# Patient Record
Sex: Female | Born: 1955
Health system: Southern US, Community
[De-identification: ages and names within clinical notes are randomized; demographics above are authoritative.]

## PROBLEM LIST (undated history)

## (undated) DIAGNOSIS — E119 Type 2 diabetes mellitus without complications: Secondary | ICD-10-CM

## (undated) DIAGNOSIS — E785 Hyperlipidemia, unspecified: Secondary | ICD-10-CM

## (undated) DIAGNOSIS — I10 Essential (primary) hypertension: Secondary | ICD-10-CM

## (undated) DIAGNOSIS — E559 Vitamin D deficiency, unspecified: Secondary | ICD-10-CM

## (undated) DIAGNOSIS — G8190 Hemiplegia, unspecified affecting unspecified side: Secondary | ICD-10-CM

## (undated) DIAGNOSIS — F419 Anxiety disorder, unspecified: Secondary | ICD-10-CM

## (undated) DIAGNOSIS — I639 Cerebral infarction, unspecified: Secondary | ICD-10-CM

## (undated) DIAGNOSIS — M810 Age-related osteoporosis without current pathological fracture: Secondary | ICD-10-CM

## (undated) HISTORY — DX: Vitamin D deficiency, unspecified: E55.9

## (undated) HISTORY — DX: Hemiplegia, unspecified affecting unspecified side: G81.90

## (undated) HISTORY — DX: Type 2 diabetes mellitus without complications: E11.9

## (undated) HISTORY — DX: Age-related osteoporosis without current pathological fracture: M81.0

## (undated) HISTORY — DX: Cerebral infarction, unspecified: I63.9

## (undated) HISTORY — DX: Anxiety disorder, unspecified: F41.9

## (undated) HISTORY — PX: HERNIA REPAIR: SHX51

## (undated) HISTORY — DX: Essential (primary) hypertension: I10

## (undated) HISTORY — DX: Hyperlipidemia, unspecified: E78.5

## (undated) HISTORY — PX: TONSILLECTOMY: SUR1361

---

## 1999-03-27 ENCOUNTER — Ambulatory Visit (HOSPITAL_COMMUNITY): Admission: RE | Admit: 1999-03-27 | Discharge: 1999-03-27 | Payer: Self-pay | Admitting: Family Medicine

## 1999-03-27 ENCOUNTER — Encounter: Payer: Self-pay | Admitting: Family Medicine

## 2002-11-27 ENCOUNTER — Encounter (HOSPITAL_BASED_OUTPATIENT_CLINIC_OR_DEPARTMENT_OTHER): Payer: Self-pay | Admitting: General Surgery

## 2002-11-30 ENCOUNTER — Ambulatory Visit (HOSPITAL_COMMUNITY): Admission: RE | Admit: 2002-11-30 | Discharge: 2002-11-30 | Payer: Self-pay | Admitting: General Surgery

## 2004-12-03 ENCOUNTER — Encounter: Admission: RE | Admit: 2004-12-03 | Discharge: 2004-12-03 | Payer: Self-pay | Admitting: Family Medicine

## 2005-01-14 ENCOUNTER — Other Ambulatory Visit: Admission: RE | Admit: 2005-01-14 | Discharge: 2005-01-14 | Payer: Self-pay | Admitting: Family Medicine

## 2005-02-23 ENCOUNTER — Encounter: Admission: RE | Admit: 2005-02-23 | Discharge: 2005-05-24 | Payer: Self-pay | Admitting: Family Medicine

## 2014-04-05 ENCOUNTER — Telehealth: Payer: Self-pay | Admitting: Diagnostic Neuroimaging

## 2014-04-05 NOTE — Telephone Encounter (Signed)
Sarah from Dr. Michelle NasutiKnowlton's office calling to get diagnosis and procedure code for patient in order for [facility] to [facility], please return call and advise.

## 2014-04-05 NOTE — Telephone Encounter (Signed)
Called Sarah @ Dr. Michelle NasutiKnowlton's office. Advised we will not see the patient until 04/10/2014, may need to research on their end. Not sure how our office can be of assistance with the referral.  She agreed.

## 2014-04-10 ENCOUNTER — Telehealth: Payer: Self-pay | Admitting: *Deleted

## 2014-04-10 ENCOUNTER — Ambulatory Visit (INDEPENDENT_AMBULATORY_CARE_PROVIDER_SITE_OTHER): Payer: 59 | Admitting: Diagnostic Neuroimaging

## 2014-04-10 ENCOUNTER — Encounter: Payer: Self-pay | Admitting: Diagnostic Neuroimaging

## 2014-04-10 VITALS — BP 127/80 | HR 62 | Temp 98.2°F | Ht 66.0 in | Wt 308.0 lb

## 2014-04-10 DIAGNOSIS — I639 Cerebral infarction, unspecified: Secondary | ICD-10-CM

## 2014-04-10 NOTE — Patient Instructions (Signed)
Try physical therapy for right leg pain.  Take topiramate 50mg  at bedtime consistently for head prevention.  Try a fitness tracker (fitbit, misfit shine, moov now).

## 2014-04-10 NOTE — Progress Notes (Signed)
[facility]  PATIENT: Kelsey DrapeKathryn Mendez DOB: Aug 25, 1955  REFERRING CLINICIAN: Karilyn CotaGosrani HISTORY FROM: patient  REASON FOR VISIT: new consult    HISTORICAL  CHIEF COMPLAINT:  Chief Complaint  Patient presents with  . Cerebrovascular Accident  . Extremity Weakness  . Headache    HISTORY OF PRESENT ILLNESS:   58 year old female with hypertension, here for evaluation of post stroke memory loss and right leg pain.  01/16/2013 patient was living in [place], at work, had headache and took an aspirin. She is having difficulty fixing her computer. IT help came in was easily able to fix the computer. Patient drove home. Patient noticed her words were not coming out correctly. Next day patient had elevated blood pressure. The next day patient was having more slurred speech and went to the hospital. She had right arm and right leg weakness as well. Patient was diagnosed with a stroke, evaluated and then discharged with speech therapy, physical neck basal therapy.  [place] [facility] [place]. Patient still has ongoing cognitive problems, memory difficulty, right leg pain. Patient also having intermittent headaches in the evening. Sometimes she has photophobia and throbbing sensation. Patient was prescribed Topamax which has helped a little bit.  REVIEW OF SYSTEMS: Full 14 system review of systems performed and notable only for depression anxiety to much sleep disinterest in activities sleepiness restless legs headache numbness weakness feeling cold.  ALLERGIES: No Known Allergies  HOME MEDICATIONS: No outpatient prescriptions prior to visit.   No facility-administered medications prior to visit.   Prior to Admission medications   Medication Sig Start Date End Date Taking? Authorizing Provider  aspirin ([facility] ASPIRIN) 325 MG tablet Take 325 mg by mouth daily.   Yes Historical Provider, MD  atorvastatin (LIPITOR) 40 MG tablet Take 40 mg by mouth daily.    Yes Historical Provider, MD  metoprolol tartrate (LOPRESSOR) 25 MG tablet Take 25 mg by mouth 2 (two) times daily.   Yes Historical Provider, MD  topiramate (TOPAMAX) 50 MG tablet Take 50 mg by mouth at bedtime.   Yes Historical Provider, MD  escitalopram (LEXAPRO) 10 MG tablet Take 10 mg by mouth daily.    Historical Provider, MD    PAST MEDICAL HISTORY: Past Medical History  Diagnosis Date  . Stroke   . Hypertension     PAST SURGICAL HISTORY: Past Surgical History  Procedure Laterality Date  . Cesarean section    . Tonsillectomy    . Hernia repair      FAMILY HISTORY: Family History  Problem Relation Age of Onset  . Hypertension Mother   . Diabetes Mother   . Cancer Father   . Hypertension Father   . Glaucoma Father   . Congestive Heart Failure Sister   . Sarcoidosis Sister     SOCIAL HISTORY:  History   Social History  . Marital Status: Single    Spouse Name: N/A    Number of Children: 1  . Years of Education: BS   Occupational History  .  Other    disabled   Social History Main Topics  . Smoking status: Former Smoker -- 0.20 packs/day for 9 years    Types: Cigarettes  . Smokeless tobacco: Not on file  . Alcohol Use: No  . Drug Use: No  . Sexual Activity: Not on file   Other Topics Concern  . Not on file   Social History Narrative   Patient lives at home alone.   Caffeine Use: 2-3 cups  daily     PHYSICAL EXAM  Filed Vitals:   04/10/14 0837  BP: 127/80  Pulse: 62  Temp: 98.2 F (36.8 C)  TempSrc: Oral  Height: 5\' 6"  (1.676 m)  Weight: 308 lb (139.708 kg)    Body mass index is 49.74 kg/(m^2).   Visual Acuity Screening   Right eye Left eye Both eyes  Without correction:     With correction: 20/40 20/30     No flowsheet data found.  GENERAL EXAM: Patient is in no distress; well developed, nourished and groomed; neck is supple  CARDIOVASCULAR: Regular rate and rhythm, no murmurs, no carotid bruits  NEUROLOGIC: MENTAL STATUS:  awake, alert, oriented to person, place and time, recent and remote memory intact, normal attention and concentration, language fluent, comprehension intact, naming intact, fund of knowledge appropriate; SLIGHTLY SLOWED SPEECH PATTERN. CRANIAL NERVE: no papilledema on fundoscopic exam, pupils equal and reactive to light, visual fields full to confrontation, extraocular muscles intact, no nystagmus, facial sensation symmetric, DECR RIGHT NL FOLD AND RIGHT LOWER FACIAL STRENGTH; hearing intact, palate elevates symmetrically, uvula midline, shoulder shrug symmetric, tongue midline; MINIMAL DYSARTHRIA. MOTOR: normal bulk and tone, full strength in the LUE, LLE; RUE 4+. RLE 4 PROX, 5 DISTAL SENSORY: normal and symmetric to light touch, pinprick, temperature, vibration COORDINATION: finger-nose-finger, fine finger movements normal REFLEXES: deep tendon reflexes present and symmetric GAIT/STATION: narrow based gait; able to walk on toes, heels; romberg is negative    DIAGNOSTIC DATA (LABS, IMAGING, TESTING) - I reviewed patient records, labs, notes, testing and imaging myself where available.  No results found for: WBC, HGB, HCT, MCV, PLT No results found for: NA, K, CL, CO2, GLUCOSE, BUN, CREATININE, CALCIUM, PROT, ALBUMIN, AST, ALT, ALKPHOS, BILITOT, GFRNONAA, GFRAA No results found for: CHOL, HDL, LDLCALC, LDLDIRECT, TRIG, CHOLHDL No results found for: ZOXW9UHGBA1C No results found for: VITAMINB12 No results found for: TSH    ASSESSMENT AND PLAN  58 y.o. year old female here with history of stroke in 2014, likely left MCA distribution. Now with post stroke pain in right leg and mild cognitive difficulties. Also with migraine type headaches.  PLAN: - PT evaluation - request prior records - continue aspirin, statin, BP control for secondary stroke prevention - increase TPX to 50mg  BID  Orders Placed This Encounter  Procedures  . Ambulatory referral to Physical Therapy   Return in about 6  months (around 10/09/2014).    Suanne MarkerVIKRAM R. Lakeva Hollon, MD 04/10/2014, 9:45 AM Certified in Neurology, Neurophysiology and Neuroimaging  [facility] [address], [address] [place], [place] [address] [phone]

## 2014-04-10 NOTE — Telephone Encounter (Signed)
[facility]10[facility]14.

## 2014-05-07 ENCOUNTER — Telehealth: Payer: Self-pay | Admitting: *Deleted

## 2014-05-07 NOTE — Telephone Encounter (Signed)
Form, [employer] to [place] and Dr Marjory LiesPenumalli to be completed 05-07-14.

## 2014-05-08 DIAGNOSIS — Z0289 Encounter for other administrative examinations: Secondary | ICD-10-CM

## 2014-05-09 NOTE — Telephone Encounter (Signed)
Placed forms in Dr. Penumalli's in basket in office.  

## 2014-05-14 ENCOUNTER — Telehealth: Payer: Self-pay | Admitting: *Deleted

## 2014-05-14 NOTE — Telephone Encounter (Signed)
Form,[employer] received,completed by Dr Marjory LiesPenumalli and Tad Mooreasandra faxed 05/14/14.

## 2014-05-14 NOTE — Telephone Encounter (Signed)
Gave forms back to medical records. Lupita Leash(Donna)

## 2014-05-15 ENCOUNTER — Other Ambulatory Visit: Payer: Self-pay | Admitting: Diagnostic Neuroimaging

## 2014-05-15 ENCOUNTER — Telehealth: Payer: Self-pay

## 2014-05-15 ENCOUNTER — Ambulatory Visit: Payer: 59 | Attending: Diagnostic Neuroimaging

## 2014-05-15 DIAGNOSIS — I6980 Unspecified sequelae of other cerebrovascular disease: Secondary | ICD-10-CM | POA: Diagnosis not present

## 2014-05-15 DIAGNOSIS — R531 Weakness: Secondary | ICD-10-CM | POA: Diagnosis not present

## 2014-05-15 DIAGNOSIS — R269 Unspecified abnormalities of gait and mobility: Secondary | ICD-10-CM | POA: Insufficient documentation

## 2014-05-15 DIAGNOSIS — R279 Unspecified lack of coordination: Secondary | ICD-10-CM | POA: Insufficient documentation

## 2014-05-15 DIAGNOSIS — I639 Cerebral infarction, unspecified: Secondary | ICD-10-CM

## 2014-05-15 DIAGNOSIS — IMO0002 Reserved for concepts with insufficient information to code with codable children: Secondary | ICD-10-CM

## 2014-05-15 NOTE — Therapy (Signed)
[facility] [address] [place], [place], [address] Phone: [phone]   Fax:  [phone]  Physical Therapy Evaluation  Patient Details  Name: Kelsey Mendez MRN: [id number] Date of Birth: [birth year]  Encounter Date: 05/15/2014      PT End of Session - 05/15/14 1630    Visit Number 1   Number of Visits 17   Date for PT Re-Evaluation 07/14/14   Authorization Type [employer]   PT Start Time 1533   PT Stop Time 1614   PT Time Calculation (min) 41 min   Equipment Utilized During Treatment Gait belt   Activity Tolerance Patient tolerated treatment well   Behavior During Therapy [facility] for tasks assessed/performed      Past Medical History  Diagnosis Date  . Stroke   . Hypertension   . Osteoporosis     per patient    Past Surgical History  Procedure Laterality Date  . Cesarean section    . Tonsillectomy    . Hernia repair      There were no vitals taken for this visit.  Visit Diagnosis:  Abnormality of gait - Plan: PT plan of care cert/re-cert  Generalized weakness - Plan: PT plan of care cert/re-cert  Lack of coordination due to stroke - Plan: PT plan of care cert/re-cert      Subjective Assessment - 05/15/14 1539    Symptoms R-sided weakness, R LE numbness/tingling and pain, impaired balance during ambulation, falls during ambulation due to R toes "dragging on floor".    Patient Stated Goals Strengthen R side of body, loose weight to increase endurance and decrease risk of diabetes, walk without falling, to have better balance on steps/walking   Currently in Pain? Yes   Pain Score 5    Pain Location Leg   Pain Orientation Right   Pain Descriptors / Indicators Aching   Pain Type Chronic pain  occasional numbness   Pain Onset More than a month ago   Pain Frequency Constant   Aggravating Factors  sitting in a tight chair, walking, lying on R LE   Pain Relieving Factors rest          [facility] PT  Assessment - 05/15/14 0001    Precautions   Precautions Fall   Restrictions   Weight Bearing Restrictions No   Balance Screen   Has the patient fallen in the past 6 months Yes   How many times? 1-2   Has the patient had a decrease in activity level because of a fear of falling?  Yes   Is the patient reluctant to leave their home because of a fear of falling?  No   Home Environment   Living Enviornment Private residence   Living Arrangements Alone   Available Help at Discharge Family   Type of Home --  condo   Home Access Stairs to enter   Entrance Stairs-Number of Steps 1   Entrance Stairs-Rails None   Home Layout One level   Home Equipment None   Prior Function   Level of Independence Independent with basic ADLs;Independent with homemaking with ambulation;Independent with gait;Independent with transfers;Requires assistive device for independence   Cognition   Overall Cognitive Status Impaired/Different from baseline   Area of Impairment --  pt reported cognitive impairments and speech issues   Observation/Other Assessments   Observations Pt has difficulty with memory and word finding per pt report and was evident during eval. Pt reported she was unable to complete speech therapy due  to [place]   Sensation   Light Touch Impaired by gross assessment   Additional Comments Decreased R UE/LE light touch.   Coordination   Gross Motor Movements are Fluid and Coordinated Yes   Fine Motor Movements are Fluid and Coordinated No  decreased speed during alternating movements/finger to thumb   AROM   Overall AROM  Within functional limits for tasks performed   Strength   Overall Strength Deficits   Overall Strength Comments R LE grossly 3+/5 to 4/5, L UE grossly 4/5 to 4+/5.   Transfers   Transfers Sit to Stand;Stand to Sit   Sit to Stand 5: Supervision;With upper extremity assist;From chair/3-in-1   Stand to Sit 5: Supervision;With upper extremity assist;To  chair/3-in-1   Ambulation/Gait   Ambulation/Gait Yes   Ambulation/Gait Assistance 5: Supervision   Ambulation/Gait Assistance Details Pt ambulated over even terrain without LOB.   Ambulation Distance (Feet) 100 Feet   Assistive device None   Gait Pattern Step-through pattern;Decreased dorsiflexion - right;Trendelenburg  decreased trunk rotation   Gait velocity 2.429ft/sec.  no AD   Balance   Balance Assessed Yes   Static Standing Balance   Static Standing - Balance Support No upper extremity supported   Static Standing - Level of Assistance 4: Min assist;Other (comment)  min guard   Static Standing - Comment/# of Minutes Pt was able to perform feet apart/feet together with eyes open for 30 seconds without LOB, and feet together with eyes closed for 10 seconds with supervision due to increased postural sway. Pt unable to hold tandem stance without min A. Pt able to perform R single leg stance for 4 seconds before requiring min A to maintain balance.   Standardized Balance Assessment   Standardized Balance Assessment Timed Up and Go Test   Timed Up and Go Test   TUG Normal TUG   Normal TUG (seconds) 13.62  without AD                            PT Short Term Goals - 05/15/14 1634    PT SHORT TERM GOAL #1   Title Pt will be independent in HEP to improve functional mobility. Target date: 06/12/14.   Status New   PT SHORT TERM GOAL #2   Title Perform BERG and write STG and LTG. Target date: 06/12/14.   Status New   PT SHORT TERM GOAL #3   Title Pt will improve TUG time to < 13 seconds to decrease falls risk. Target date: 06/12/14.   Status New   PT SHORT TERM GOAL #4   Title Pt will report no falls in the last two weeks to improve safety. Target date: 06/12/14.   Status New   PT SHORT TERM GOAL #5   Title Pt will ambulate 300' over even/uneven terrain with supervision to improve functional mobility. Target date: 06/12/14.   Status New           PT Long Term  Goals - 05/15/14 1637    PT LONG TERM GOAL #1   Title Pt will verbalize agreement and understanding of CVA symptoms/signs to decrease risk for future CVA. Target date: 07/10/14.   Status New   PT LONG TERM GOAL #2   Title Have pt complete FOTO and write appropriate goal. Target date: 07/10/14.   Status New   PT LONG TERM GOAL #3   Title Pt will ambulate 600' over even/uneven terrain, independently,  to improve functional mobility. Target date: 07/10/14.   Status New   PT LONG TERM GOAL #4   Title Pt will verbalize plans to join fitness center after PT d/c to improve overall fitness. Target date: 07/10/14.   Status New               Plan - 05/15/14 1631    Clinical Impression Statement Pt is a pleasant 58 y/o female presenting to [facility]PP[facility] neuro s/p two CVAs in 12/2012. Pt reported her R-side is still weak from the stroke, she ambulates slowly due to impaired balance, R LE pain, and has fallen 1-2 times. Pt has history of plantar fasciitis on R LE.     Pt will benefit from skilled therapeutic intervention in order to improve on the following deficits Abnormal gait;Impaired flexibility;Decreased coordination;Decreased endurance;Impaired sensation;Obesity;Decreased knowledge of use of DME;Decreased balance;Decreased strength;Decreased mobility   Rehab Potential Good   PT Frequency 2x / week   PT Duration 8 weeks   PT Treatment/Interventions ADLs/Self Care Home Management;Gait training;Neuromuscular re-education;Stair training;Biofeedback;Functional mobility training;Patient/family education;Therapeutic activities;Cryotherapy;Electrical Stimulation;Therapeutic exercise;Manual techniques;Balance training;DME Instruction   PT Next Visit Plan Peform BERG, initiate balance/strength HEP, write FOTO goal and check for MD signature on PT cert   Consulted and Agree with Plan of Care Patient     Pt would benefit from speech therapy evaluation to address memory deficits and word finding  issues.    Problem List There are no active problems to display for this patient.   Kennley Schwandt L 05/15/2014, 4:45 PM  [facility] [address] [place], [place], [address] Phone: [phone]   Fax:  [phone]    Zerita BoersJennifer Ardit Danh, PT,DPT 05/15/2014 4:45 PM Phone: [phone] Fax: [phone]

## 2014-05-15 NOTE — Telephone Encounter (Signed)
Dr. Marjory LiesPenumalli~  I saw your patient, Kelsey Mendez, for a PT evaluation this afternoon. I feel that she would benefit from a speech therapy evaluation to address memory and word finding issues. If you agree with this, please send us a speech therapy referral order.   Thank you,  Zerita BoersJennifer Ameir Faria, PT, DPT

## 2014-05-20 ENCOUNTER — Telehealth: Payer: Self-pay | Admitting: *Deleted

## 2014-05-20 NOTE — Telephone Encounter (Signed)
Form,[employer] Parking Placard to Cassandra and Dr Marjory LiesPenumalli to complete 05-20-14.

## 2014-05-20 NOTE — Telephone Encounter (Signed)
Placed form in Dr. Richrd HumblesPenumalli's in basket in office.

## 2014-05-20 NOTE — Telephone Encounter (Signed)
Form,[employer] Parking Placard received,completed by Dr Marjory LiesPenumalli and Tad Mooreasandra at front desk for patient 05/20/14.

## 2014-05-28 ENCOUNTER — Ambulatory Visit: Payer: BLUE CROSS/BLUE SHIELD | Attending: Diagnostic Neuroimaging

## 2014-05-28 DIAGNOSIS — R269 Unspecified abnormalities of gait and mobility: Secondary | ICD-10-CM | POA: Insufficient documentation

## 2014-05-28 DIAGNOSIS — I6980 Unspecified sequelae of other cerebrovascular disease: Secondary | ICD-10-CM | POA: Insufficient documentation

## 2014-05-28 DIAGNOSIS — R279 Unspecified lack of coordination: Secondary | ICD-10-CM | POA: Insufficient documentation

## 2014-05-28 DIAGNOSIS — R531 Weakness: Secondary | ICD-10-CM | POA: Insufficient documentation

## 2014-05-30 ENCOUNTER — Ambulatory Visit: Payer: BLUE CROSS/BLUE SHIELD

## 2014-06-04 ENCOUNTER — Ambulatory Visit: Payer: BLUE CROSS/BLUE SHIELD

## 2014-06-04 ENCOUNTER — Ambulatory Visit: Payer: 59 | Admitting: Physical Therapy

## 2014-06-06 ENCOUNTER — Ambulatory Visit: Payer: BLUE CROSS/BLUE SHIELD

## 2014-06-07 ENCOUNTER — Ambulatory Visit: Payer: BLUE CROSS/BLUE SHIELD

## 2014-06-11 ENCOUNTER — Ambulatory Visit: Payer: BLUE CROSS/BLUE SHIELD

## 2014-06-13 ENCOUNTER — Ambulatory Visit: Payer: BLUE CROSS/BLUE SHIELD

## 2014-06-18 ENCOUNTER — Ambulatory Visit: Payer: BLUE CROSS/BLUE SHIELD

## 2014-06-18 ENCOUNTER — Telehealth: Payer: Self-pay

## 2014-06-18 DIAGNOSIS — R531 Weakness: Secondary | ICD-10-CM

## 2014-06-18 DIAGNOSIS — I6980 Unspecified sequelae of other cerebrovascular disease: Secondary | ICD-10-CM | POA: Diagnosis not present

## 2014-06-18 DIAGNOSIS — R269 Unspecified abnormalities of gait and mobility: Secondary | ICD-10-CM | POA: Diagnosis present

## 2014-06-18 DIAGNOSIS — IMO0002 Reserved for concepts with insufficient information to code with codable children: Secondary | ICD-10-CM

## 2014-06-18 DIAGNOSIS — R279 Unspecified lack of coordination: Secondary | ICD-10-CM | POA: Diagnosis not present

## 2014-06-18 NOTE — Telephone Encounter (Signed)
PT attempted to call pt in regarding no-show to 06/18/14 (1:15pm) PT appt. However, no one answered and PT was unable to leave message. If pt calls back, she needs to be informed that she needs to come to next PT appointment or she will be discharged, as pt has not been seen since PT eval on 05/15/14.

## 2014-06-18 NOTE — Therapy (Signed)
[facility] [address] [place], [place], [address] Phone: [phone]   Fax:  [phone]  Physical Therapy Treatment  Patient Details  Name: Kelsey Mendez MRN: [id number] Date of Birth: Jan 16, 1956 Referring Provider:  Doree Albee, MD  Encounter Date: 06/18/2014      PT End of Session - 06/18/14 1439    Visit Number 2   Number of Visits 17   Date for PT Re-Evaluation 07/14/14   Authorization Type UHC   PT Start Time 1331   PT Stop Time 1435   PT Time Calculation (min) 64 min   Equipment Utilized During Treatment Gait belt   Activity Tolerance Patient tolerated treatment well   Behavior During Therapy [facility] for tasks assessed/performed      Past Medical History  Diagnosis Date  . Stroke   . Hypertension   . Osteoporosis     per patient    Past Surgical History  Procedure Laterality Date  . Cesarean section    . Tonsillectomy    . Hernia repair      There were no vitals taken for this visit.  Visit Diagnosis:  Abnormality of gait  Generalized weakness  Lack of coordination due to stroke      Subjective Assessment - 06/18/14 1334    Symptoms Pt arrived 15 minutes late, she thought that her appt. was at 1330. Pt reported she has not been back to PT due to switching insurance and waiting on [employer] to send her new card. Pt denied falls or changes since last visit.   Patient Stated Goals Strengthen R side of body, loose weight to increase endurance and decrease risk of diabetes, walk without falling, to have better balance on steps/walking   Currently in Pain? Yes   Pain Score 5    Pain Location Leg   Pain Orientation Right   Pain Descriptors / Indicators Aching   Pain Type Chronic pain   Pain Onset More than a month ago   Pain Frequency Constant   Aggravating Factors  sitting in a tight chair, lying on R LE   Pain Relieving Factors rest   Multiple Pain Sites Yes   Pain Score 5   Pain Type  Acute pain   Pain Location Head  headache   Pain Orientation Mid   Pain Descriptors / Indicators Aching   Pain Frequency Several days a week     I treated this patient from 2:06-2:35 while Geoffry Paradise worked with another patient. Finished last few elements of [employer] and taught, performed and provided handout for HEP. See pt instructions for details. Delrae Sawyers, PT,DPT, NCS 06/18/2014 5:16 PM Phone: [phone] Fax: [phone]                   [facility] Adult PT Treatment/Exercise - 06/18/14 1342    Ambulation/Gait   Ambulation/Gait Yes   Ambulation/Gait Assistance 5: Supervision   Ambulation/Gait Assistance Details Pt ambulated over even/uneven terrain. VC's to improve stride length.   Ambulation Distance (Feet) 345 Feet   Assistive device None   Gait Pattern Step-through pattern;Decreased dorsiflexion - left;Trendelenburg  decreased trunk rotation   Standardized Balance Assessment   Standardized Balance Assessment Timed Up and Go Test;Berg Balance Test   Berg Balance Test   Sit to Stand Able to stand without using hands and stabilize independently   Standing Unsupported Able to stand safely 2 minutes   Sitting with Back Unsupported but Feet Supported on Floor or Stool  Able to sit safely and securely 2 minutes   Stand to Sit Controls descent by using hands   Transfers Able to transfer safely, definite need of hands   Standing Unsupported with Eyes Closed Able to stand 10 seconds with supervision   Standing Ubsupported with Feet Together Able to place feet together independently and stand 1 minute safely   From Standing, Reach Forward with Outstretched Arm Can reach forward >12 cm safely (5")   From Standing Position, Pick up Object from Floor Able to pick up shoe, needs supervision   From Standing Position, Turn to Look Behind Over each Shoulder Looks behind from both sides and weight shifts well   Turn 360 Degrees Able to turn 360 degrees  safely but slowly   Standing Unsupported, Alternately Place Feet on Step/Stool Able to stand independently and safely and complete 8 steps in 20 seconds   Standing Unsupported, One Foot in Front Able to place foot tandem independently and hold 30 seconds   Standing on One Leg Able to lift leg independently and hold equal to or more than 3 seconds   Total Score 47   Timed Up and Go Test   TUG Normal TUG   Normal TUG (seconds) 12.8  no AD                PT Education - 06/18/14 1438    Education provided Yes   Education Details HEP see pt instructions   Person(s) Educated Patient   Methods Explanation;Demonstration;Handout   Comprehension Verbalized understanding;Returned demonstration          PT Short Term Goals - 06/18/14 1707    PT SHORT TERM GOAL #1   Title Pt will be independent in HEP to improve functional mobility. Target date: 06/12/14.   Status On-going   PT SHORT TERM GOAL #2   Title Perform BERG and write STG and LTG. Target date: 06/12/14.   Status Achieved   PT SHORT TERM GOAL #3   Title Pt will improve TUG time to < 13 seconds to decrease falls risk. Target date: 06/12/14.   Status Achieved   PT SHORT TERM GOAL #4   Title Pt will report no falls in the last two weeks to improve safety. Target date: 06/12/14.   Status Achieved   PT SHORT TERM GOAL #5   Title Pt will ambulate 300' over even/uneven terrain with supervision to improve functional mobility. Target date: 06/12/14.   Status Achieved           PT Long Term Goals - 06/18/14 1708    PT LONG TERM GOAL #1   Title Pt will verbalize agreement and understanding of CVA symptoms/signs to decrease risk for future CVA. Target date: 07/10/14.   Status On-going   PT LONG TERM GOAL #2   Title Have pt complete FOTO and write appropriate goal. Target date: 07/10/14.   Status Achieved   PT LONG TERM GOAL #3   Title Pt will ambulate 600' over even/uneven terrain, independently, to improve functional mobility.  Target date: 07/10/14.   Status On-going   PT LONG TERM GOAL #4   Title Pt will verbalize plans to join fitness center after PT d/c to improve overall fitness. Target date: 07/10/14.   Status On-going   PT LONG TERM GOAL #5   Title Pt will improve BERG balance score to >/=51/56 to reduce falls risk. Target date: 07/10/14.   Status New   Additional Long Term Goals   Additional Long Term Goals  Yes   PT LONG TERM GOAL #6   Title Pt will improve SIS-mobility score by 10% to improve quality of life. Target date: 07/10/14.   Baseline 36.1%   Status New               Plan - 06/18/14 1705    Clinical Impression Statement Pt scored 47/56 on BERG balance scale, indicating pt is at a moderate falls risk. Pt demonstrated progress as she performed TUG in <13 seconds. Pt met STGs 3,4, and 5. Pt would continue to benefit from skilled PT to improve safety during functional mobility.    Pt will benefit from skilled therapeutic intervention in order to improve on the following deficits Abnormal gait;Impaired flexibility;Decreased coordination;Decreased endurance;Impaired sensation;Obesity;Decreased knowledge of use of DME;Decreased balance;Decreased strength;Decreased mobility   Rehab Potential Good   PT Frequency 2x / week   PT Duration 8 weeks   PT Treatment/Interventions ADLs/Self Care Home Management;Gait training;Neuromuscular re-education;Stair training;Biofeedback;Functional mobility training;Patient/family education;Therapeutic activities;Cryotherapy;Electrical Stimulation;Therapeutic exercise;Manual techniques;Balance training;DME Instruction   PT Next Visit Plan initiate strength HEP   Consulted and Agree with Plan of Care Patient        Problem List There are no active problems to display for this patient.   Gracyn Allor L 06/18/2014, 5:16 PM  [facility] [address] [place], [place], [address] Phone: [phone]    Fax:  [phone]    I saw this patient from 2:06-2:35. I finished the Berg and provided HEP. Delrae Sawyers, PT,DPT,NCS 06/18/2014 5:16 PM Phone: [phone] Fax: [phone]   I saw this pt from 1330-1406. I ambulated with the, assessed the TUG, and started the BERG. Geoffry Paradise, PT,DPT 06/18/2014 5:16 PM Phone: [phone] Fax: [phone]

## 2014-06-18 NOTE — Patient Instructions (Addendum)
Turning   Turn in a full circle 360 degrees toward the left, then 360 degrees toward the right. Perform in front of counter. Do 5 turns to each side daily.  Copyright  VHI. All rights reserved.    Braiding   Move to side: 1) cross right leg in front of left, 2) bring back leg out to side, then 3) cross right leg behind left, 4) bring left leg out to side. Continue sequence in same direction. Reverse sequence, moving in opposite direction.  Perform along countertop, hold on if needed. Do 3 laps to the left and 3 laps to the right daily. Copyright  VHI. All rights reserved.     Functional Quadriceps: Sit to Stand  Sit on edge of chair, bend knees so your feet are behind your knees on the floor. Lean forward and feel your weight shift to your toes. Stand upright, extending knees fully. Repeat 5 times per set. Do 2 sets per session. Do 1 sessions per day.  http://orth.exer.us/735   Copyright  VHI. All rights reserved.  Feet Together (Compliant Surface) Head Motion - Eyes Closed   Stand in a corner with a chair in front of you on compliant surface: two stacked pillows with feet together. Close eyes and move head slowly, horizontally 10x, then diagonally each direction 10x.   Copyright  VHI. All rights reserved.

## 2014-06-20 ENCOUNTER — Ambulatory Visit: Payer: BLUE CROSS/BLUE SHIELD

## 2014-06-20 DIAGNOSIS — R531 Weakness: Secondary | ICD-10-CM

## 2014-06-20 DIAGNOSIS — IMO0002 Reserved for concepts with insufficient information to code with codable children: Secondary | ICD-10-CM

## 2014-06-20 DIAGNOSIS — R269 Unspecified abnormalities of gait and mobility: Secondary | ICD-10-CM | POA: Diagnosis not present

## 2014-06-20 NOTE — Patient Instructions (Signed)
Hamstring Curl   Hold onto counter with 1-2 hands. Stand and bend left knee (heel towards buttocks). Repeat with right leg. Repeat _2 sets of 10__ reps.  Perform every other day.  Copyright  VHI. All rights reserved.  "I love a Parade" Lift   Using a chair if necessary (or hold onto counter). March right knee towards ceiling, lower, then march left knee towards ceiling then lower.  Repeat 2 sets of 10 times per leg.. Do once every other day.  http://gt2.exer.us/345   Copyright  VHI. All rights reserved.  Mini Squat: Double Leg   With feet shoulder width apart, reach forward for balance and do a mini squat. Keep knees in line with second toe. Knees do not go past toes. Repeat _10__ times per set. Rest _3__ seconds after set. Do __1_ sets per session per day.  http://plyo.exer.us/70   Copyright  VHI. All rights reserved.  EXTENSION: Standing (Active)   Stand, both feet flat. Draw right leg behind body as far as possible. Repeat with left leg. Complete _2__ sets of _10__ repetitions. Perform once every other day.  http://gtsc.exer.us/76   Copyright  VHI. All rights reserved.  ABDUCTION: Standing (Active)   Stand, feet flat, with kicking half step behind other leg. Lift right leg out to side. Repeat with left leg. Complete _2__ sets of _10__ repetitions. Perform once every other day.  http://gtsc.exer.us/110   Copyright  VHI. All rights reserved.

## 2014-06-20 NOTE — Therapy (Signed)
[facility] [address] [place], [place], [address] Phone: [phone]   Fax:  [phone]  Physical Therapy Treatment  Patient Details  Name: Kelsey DrapeKathryn Benzel MRN: [id number] Date of Birth: 01/09/1956 Referring Provider:  Wilson SingerGosrani, Nimish C, MD  Encounter Date: 06/20/2014      PT End of Session - 06/20/14 1527    Visit Number 3   Number of Visits 17   Date for PT Re-Evaluation 07/14/14   Authorization Type UHC   PT Start Time 1315   PT Stop Time 1400   PT Time Calculation (min) 45 min   Activity Tolerance Patient tolerated treatment well   Behavior During Therapy [facility] for tasks assessed/performed      Past Medical History  Diagnosis Date  . Stroke   . Hypertension   . Osteoporosis     per patient    Past Surgical History  Procedure Laterality Date  . Cesarean section    . Tonsillectomy    . Hernia repair      There were no vitals taken for this visit.  Visit Diagnosis:  Abnormality of gait  Generalized weakness  Lack of coordination due to stroke      Subjective Assessment - 06/20/14 1318    Symptoms Pt reported her legs are a little tired, as she [place] the mall today. Pt reported she had some difficulty with braiding balance activity. Pt denied falls or changes since last visit.   Patient Stated Goals Strengthen R side of body, loose weight to increase endurance and decrease risk of diabetes, walk without falling, to have better balance on steps/walking   Currently in Pain? No/denies      Therex: Standing in parallel bars with 1-2 UE support and supervision for safety, B LEs: -Hip marches, hip extension, hip abduction; 2x10. VC's and demonstration for technique and upright posture. -Hamstring curls 2x10. VC's for technique.  -Mini squats x10. VC's for technique (not to let toes go over toes). Pt required standing rest breaks after each set and two seated rest breaks due to fatigue and  B LE weakness.  Neuro Re-ed: -Braiding HEP, 4x7'. VC's and demonstration for technique.                     PT Education - 06/20/14 1322    Education provided Yes   Education Details Reviewed braiding HEP. Provided strengthening HEP.   Person(s) Educated Patient   Methods Explanation;Demonstration;Tactile cues;Verbal cues;Handout   Comprehension Verbalized understanding;Returned demonstration;Need further instruction          PT Short Term Goals - 06/18/14 1707    PT SHORT TERM GOAL #1   Title Pt will be independent in HEP to improve functional mobility. Target date: 06/12/14.   Status On-going   PT SHORT TERM GOAL #2   Title Perform BERG and write STG and LTG. Target date: 06/12/14.   Status Achieved   PT SHORT TERM GOAL #3   Title Pt will improve TUG time to < 13 seconds to decrease falls risk. Target date: 06/12/14.   Status Achieved   PT SHORT TERM GOAL #4   Title Pt will report no falls in the last two weeks to improve safety. Target date: 06/12/14.   Status Achieved   PT SHORT TERM GOAL #5   Title Pt will ambulate 300' over even/uneven terrain with supervision to improve functional mobility. Target date: 06/12/14.   Status Achieved  PT Long Term Goals - 06/20/14 1529    PT LONG TERM GOAL #1   Title Pt will verbalize agreement and understanding of CVA symptoms/signs to decrease risk for future CVA. Target date: 07/10/14.   Status On-going   PT LONG TERM GOAL #2   Title Have pt complete FOTO and write appropriate goal. Target date: 07/10/14.   Status Achieved   PT LONG TERM GOAL #3   Title Pt will ambulate 600' over even/uneven terrain, independently, to improve functional mobility. Target date: 07/10/14.   Status On-going   PT LONG TERM GOAL #4   Title Pt will verbalize plans to join fitness center after PT d/c to improve overall fitness. Target date: 07/10/14.   Status On-going   PT LONG TERM GOAL #5   Title Pt will improve BERG balance score  to >/=51/56 to reduce falls risk. Target date: 07/10/14.   Status On-going   PT LONG TERM GOAL #6   Title Pt will improve SIS-mobility score by 10% to improve quality of life. Target date: 07/10/14.   Baseline 36.1%   Status On-going               Plan - 06/20/14 1527    Clinical Impression Statement Pt demonstrated progress, as she required less cues and assist during braiding balance activity. Pt also tolerated standing strengthening HEP well. Pt continues to be limited by fatigue and B LE weakness, as she required seated and standing rest breaks during session. Pt would continue to benefit from skilled PT to improve safety during functional mobility.   Pt will benefit from skilled therapeutic intervention in order to improve on the following deficits Abnormal gait;Impaired flexibility;Decreased coordination;Decreased endurance;Impaired sensation;Obesity;Decreased knowledge of use of DME;Decreased balance;Decreased strength;Decreased mobility   Rehab Potential Good   PT Frequency 2x / week   PT Duration 8 weeks   PT Treatment/Interventions ADLs/Self Care Home Management;Gait training;Neuromuscular re-education;Stair training;Biofeedback;Functional mobility training;Patient/family education;Therapeutic activities;Cryotherapy;Electrical Stimulation;Therapeutic exercise;Manual techniques;Balance training;DME Instruction   PT Next Visit Plan Progress dynamic gait and balance training.   PT Home Exercise Plan Balance and strengthening HEP.   Consulted and Agree with Plan of Care Patient        Problem List There are no active problems to display for this patient.   Miller,Jennifer L 06/20/2014, 3:31 PM  [facility] [address] [place], [place], [address] Phone: [phone]   Fax:  [phone]    Zerita Boers, PT,DPT 06/20/2014 3:31 PM Phone: [phone] Fax: [phone]

## 2014-06-25 ENCOUNTER — Ambulatory Visit: Payer: BLUE CROSS/BLUE SHIELD | Attending: Diagnostic Neuroimaging

## 2014-06-25 DIAGNOSIS — I6980 Unspecified sequelae of other cerebrovascular disease: Secondary | ICD-10-CM | POA: Diagnosis not present

## 2014-06-25 DIAGNOSIS — R269 Unspecified abnormalities of gait and mobility: Secondary | ICD-10-CM | POA: Insufficient documentation

## 2014-06-25 DIAGNOSIS — R279 Unspecified lack of coordination: Secondary | ICD-10-CM | POA: Insufficient documentation

## 2014-06-25 DIAGNOSIS — R531 Weakness: Secondary | ICD-10-CM

## 2014-06-25 DIAGNOSIS — IMO0002 Reserved for concepts with insufficient information to code with codable children: Secondary | ICD-10-CM

## 2014-06-25 NOTE — Therapy (Signed)
[facility] [address] [place], [place], [address] Phone: [phone]   Fax:  [phone]  Physical Therapy Treatment  Patient Details  Name: Kelsey Mendez MRN: [id number] Date of Birth: 1955-10-16 Referring Provider:  Wilson SingerGosrani, Nimish C, MD  Encounter Date: 06/25/2014      PT End of Session - 06/25/14 1625    Visit Number 4   Number of Visits 17   Date for PT Re-Evaluation 07/14/14   Authorization Type UHC   PT Start Time 1533   PT Stop Time 1613   PT Time Calculation (min) 40 min   Equipment Utilized During Treatment Gait belt   Activity Tolerance Patient tolerated treatment well   Behavior During Therapy [facility] for tasks assessed/performed      Past Medical History  Diagnosis Date  . Stroke   . Hypertension   . Osteoporosis     per patient    Past Surgical History  Procedure Laterality Date  . Cesarean section    . Tonsillectomy    . Hernia repair      There were no vitals taken for this visit.  Visit Diagnosis:  Abnormality of gait  Generalized weakness  Lack of coordination due to stroke      Subjective Assessment - 06/25/14 1535    Symptoms Pt denied falls or changes since last visit.    Patient Stated Goals Strengthen R side of body, loose weight to increase endurance and decrease risk of diabetes, walk without falling, to have better balance on steps/walking   Currently in Pain? No/denies                    [facility] Adult PT Treatment/Exercise - 06/25/14 1555    Ambulation/Gait   Ambulation/Gait Yes   Ambulation/Gait Assistance 5: Supervision   Ambulation/Gait Assistance Details Pt ambulated over compliant and non-compliant surfaces, with and without head turns. Pt ambulated forwards, backwards, lateral, and hip marches over compliant surfaces (4x7'/activity). VC's to improve stride length, heel strike, and to improve eccentric control during hip marches. Cues to activate TrA  during hip marches to improve technique and balance.   Ambulation Distance (Feet) --  400, 75',16x7'   Assistive device None   Gait Pattern Step-through pattern;Decreased dorsiflexion - left;Trendelenburg   Stairs Yes   Stairs Assistance 5: Supervision   Stairs Assistance Details (indicate cue type and reason) VC's to improve anterior weight shifting   Stair Management Technique One rail Right;Alternating pattern   Number of Stairs 4   Height of Stairs 6   Ramp 5: Supervision   Ramp Details (indicate cue type and reason) x3. Pt ascended/descended ramp with supervision to ensure safety. VC's to improve weight shifting.                PT Education - 06/25/14 1622    Education provided Yes   Education Details CVA education handout. PT also educated pt on accessing [facility] "my chart" in order to review appointment/HEP information, as pt requested HEP via email but we are not able to email HEP due to HIPAA concerns. PT also educated pt on continuing to improve endurance and strength by increasing ambulation time, with a goal of 30 minutes per day.   Person(s) Educated Patient   Methods Explanation;Handout   Comprehension Verbalized understanding          PT Short Term Goals - 06/18/14 1707    PT SHORT TERM GOAL #1   Title Pt will be  independent in HEP to improve functional mobility. Target date: 06/12/14.   Status On-going   PT SHORT TERM GOAL #2   Title Perform BERG and write STG and LTG. Target date: 06/12/14.   Status Achieved   PT SHORT TERM GOAL #3   Title Pt will improve TUG time to < 13 seconds to decrease falls risk. Target date: 06/12/14.   Status Achieved   PT SHORT TERM GOAL #4   Title Pt will report no falls in the last two weeks to improve safety. Target date: 06/12/14.   Status Achieved   PT SHORT TERM GOAL #5   Title Pt will ambulate 300' over even/uneven terrain with supervision to improve functional mobility. Target date: 06/12/14.   Status Achieved            PT Long Term Goals - 06/20/14 1529    PT LONG TERM GOAL #1   Title Pt will verbalize agreement and understanding of CVA symptoms/signs to decrease risk for future CVA. Target date: 07/10/14.   Status On-going   PT LONG TERM GOAL #2   Title Have pt complete FOTO and write appropriate goal. Target date: 07/10/14.   Status Achieved   PT LONG TERM GOAL #3   Title Pt will ambulate 600' over even/uneven terrain, independently, to improve functional mobility. Target date: 07/10/14.   Status On-going   PT LONG TERM GOAL #4   Title Pt will verbalize plans to join fitness center after PT d/c to improve overall fitness. Target date: 07/10/14.   Status On-going   PT LONG TERM GOAL #5   Title Pt will improve BERG balance score to >/=51/56 to reduce falls risk. Target date: 07/10/14.   Status On-going   PT LONG TERM GOAL #6   Title Pt will improve SIS-mobility score by 10% to improve quality of life. Target date: 07/10/14.   Baseline 36.1%   Status On-going               Plan - 06/25/14 1626    Clinical Impression Statement Pt demonstrated progress, as she was able to ambulate over compliant surfaces without LOB. However, pt continues to require cues to improve stride length and heel strike due to decreased endurance and weakness during ambulation. Pt would continue to benefit from skilled PT to improve safety during functional mobility.   Pt will benefit from skilled therapeutic intervention in order to improve on the following deficits Abnormal gait;Impaired flexibility;Decreased coordination;Decreased endurance;Impaired sensation;Obesity;Decreased knowledge of use of DME;Decreased balance;Decreased strength;Decreased mobility   Rehab Potential Good   PT Frequency 2x / week   PT Duration 8 weeks   PT Treatment/Interventions ADLs/Self Care Home Management;Gait training;Neuromuscular re-education;Stair training;Biofeedback;Functional mobility training;Patient/family  education;Therapeutic activities;Cryotherapy;Electrical Stimulation;Therapeutic exercise;Manual techniques;Balance training;DME Instruction   PT Next Visit Plan Continue to progress dynamic gait/balance training. Trial cones taps on compliant/non-compliant surfaces to improve weight shifting and step-ups.   PT Home Exercise Plan Balance and strengthening HEP.   Consulted and Agree with Plan of Care Patient        Problem List There are no active problems to display for this patient.   Maeson Purohit L 06/25/2014, 4:29 PM  [facility] [address] [place], [place], [address] Phone: [phone]   Fax:  [phone]     Zerita Boers, PT,DPT 06/25/2014 4:29 PM Phone: [phone] Fax: [phone]

## 2014-06-25 NOTE — Patient Instructions (Addendum)
RE: MyChart  Dear Ms. Crouse  We are excited to introduce MyChart, a new best-in-class service that provides you online access to important information in your electronic medical record. We want to make it easier for you to view your health information - all in one secure location - when and where you need it. We expect MyChart will enhance the quality of care and service we provide. Use the activation code below to enroll in MyChart online at [email or web address]  When you register for MyChart, you can:  Marland Kitchen View your test results. . Communicate securely with your physician's office.  . View your medical history, allergies, medications, and immunizations. . Conveniently print information such as your medication lists.  If you are age 74 or older and want a member of your family to have access to your record, you must provide written consent by completing a proxy form available at our facility. Please speak to our clinical staff about guidelines regarding accounts for patients younger than age 62.  As you activate your MyChart account and need any technical assistance, please call the MyChart technical support line at [phone]) or email your question to [email or web address]. If you email your question(s), please include your name, a return phone number and the best time to reach you.  Thank you for using MyChart as your new health and wellness resource!  MyChart Activation Code:  [email or web address] Expires: 08/19/2014  1:58 PM     Ischemic Stroke A stroke (cerebrovascular accident) is the sudden death of brain tissue. It is a medical emergency. A stroke can cause permanent loss of brain function. This can cause problems with different parts of your body. A transient ischemic attack (TIA) is different because it does not cause permanent damage. A TIA is a short-lived problem of poor blood flow affecting a part of the brain. A TIA is also a serious problem because  having a TIA greatly increases the chances of having a stroke. When symptoms first develop, you cannot know if the problem might be a stroke or a TIA. CAUSES  A stroke is caused by a decrease of oxygen supply to an area of your brain. It is usually the result of a small blood clot or collection of cholesterol or fat (plaque) that blocks blood flow in the brain. A stroke can also be caused by blocked or damaged carotid arteries.  RISK FACTORS  High blood pressure (hypertension).  High cholesterol.  Diabetes mellitus.  Heart disease.  The buildup of plaque in the blood vessels (peripheral artery disease or atherosclerosis).  The buildup of plaque in the blood vessels providing blood and oxygen to the brain (carotid artery stenosis).  An abnormal heart rhythm (atrial fibrillation).  Obesity.  Smoking.  Taking oral contraceptives (especially in combination with smoking).  Physical inactivity.  A diet high in fats, salt (sodium), and calories.  Alcohol use.  Use of illegal drugs (especially cocaine and methamphetamine).  Being African American.  Being over the age of 55.  Family history of stroke.  Previous history of blood clots, stroke, TIA, or heart attack.  Sickle cell disease. SYMPTOMS  These symptoms usually develop suddenly, or may be newly present upon awakening from sleep:  Sudden weakness or numbness of the face, arm, or leg, especially on one side of the body.  Sudden trouble walking or difficulty moving arms or legs.  Sudden confusion.  Sudden personality changes.  Trouble speaking (aphasia) or understanding.  Difficulty swallowing.  Sudden trouble seeing in one or  both eyes.  Double vision.  Dizziness.  Loss of balance or coordination.  Sudden severe headache with no known cause.  Trouble reading or writing. DIAGNOSIS  Your health care provider can often determine the presence or absence of a stroke based on your symptoms, history, and  physical exam. Computed tomography (CT) of the brain is usually performed to confirm the stroke, determine causes, and determine stroke severity. Other tests may be done to find the cause of the stroke. These tests may include:  Electrocardiography.  Continuous heart monitoring.  Echocardiography.  Carotid ultrasonography.  Magnetic resonance imaging (MRI).  A scan of the brain circulation.  Blood tests. PREVENTION  The risk of a stroke can be decreased by appropriately treating high blood pressure, high cholesterol, diabetes, heart disease, and obesity and by quitting smoking, limiting alcohol, and staying physically active. TREATMENT  Time is of the essence. It is important to seek treatment at the first sign of these symptoms because you may receive a medicine to dissolve the clot (thrombolytic) that cannot be given if too much time has passed since your symptoms began. Even if you do not know when your symptoms began, get treatment as soon as possible as there are other treatment options available including oxygen, intravenous (IV) fluids, and medicines to thin the blood (anticoagulants). Treatment of stroke depends on the duration, severity, and cause of your symptoms. Medicines and dietary changes may be used to address diabetes, high blood pressure, and other risk factors. Physical, speech, and occupational therapists will assess you and work with you to improve any functions impaired by the stroke. Measures will be taken to prevent short-term and long-term complications, including infection from breathing foreign material into the lungs (aspiration pneumonia), blood clots in the legs, bedsores, and falls. Rarely, surgery may be needed to remove large blood clots or to open up blocked arteries. HOME CARE INSTRUCTIONS   Take medicines only as directed by your health care provider. Follow the directions carefully. Medicines may be used to control risk factors for a stroke. Be sure you  understand all your medicine instructions.  You may be told to take a medicine to thin the blood, such as aspirin or the anticoagulant warfarin. Warfarin needs to be taken exactly as instructed.  Too much and too little warfarin are both dangerous. Too much warfarin increases the risk of bleeding. Too little warfarin continues to allow the risk for blood clots. While taking warfarin, you will need to have regular blood tests to measure your blood clotting time. These blood tests usually include both the PT and INR tests. The PT and INR results allow your health care provider to adjust your dose of warfarin. The dose can change for many reasons. It is critically important that you take warfarin exactly as prescribed, and that you have your PT and INR levels drawn exactly as directed.  Many foods, especially foods high in vitamin K, can interfere with warfarin and affect the PT and INR results. Foods high in vitamin K include spinach, kale, broccoli, cabbage, collard and turnip greens, brussels sprouts, peas, cauliflower, seaweed, and parsley, as well as beef and pork liver, green tea, and soybean oil. You should eat a consistent amount of foods high in vitamin K. Avoid major changes in your diet, or notify your health care provider before changing your diet. Arrange a visit with a dietitian to answer your questions.  Many medicines can interfere with warfarin and affect the PT and INR results. You must tell your  health care provider about any and all medicines you take. This includes all vitamins and supplements. Be especially cautious with aspirin and anti-inflammatory medicines. Do not take or discontinue any prescribed or over-the-counter medicine except on the advice of your health care provider or pharmacist.  Warfarin can have side effects, such as excessive bruising or bleeding. You will need to hold pressure over cuts for longer than usual. Your health care provider or pharmacist will discuss other  potential side effects.  Avoid sports or activities that may cause injury or bleeding.  Be mindful when shaving, flossing your teeth, or handling sharp objects.  Alcohol can change the body's ability to handle warfarin. It is best to avoid alcoholic drinks or consume only very small amounts while taking warfarin. Notify your health care provider if you change your alcohol intake.  Notify your dentist or other health care providers before procedures.  If swallow studies have determined that your swallowing reflex is present, you should eat healthy foods. Including 5 or more servings of fruits and vegetables a day may reduce the risk of stroke. Foods may need to be a certain consistency (soft or pureed), or small bites may need to be taken in order to avoid aspirating or choking. Certain dietary changes may be advised to address high blood pressure, high cholesterol, diabetes, or obesity.  Food choices that are low in sodium, saturated fat, trans fat, and cholesterol are recommended to manage high blood pressure.  Food choies that are high in fiber, and low in saturated fat, trans fat, and cholesterol may control cholesterol levels.  Controlling carbohydrates and sugar intake is recommended to manage diabetes.  Reducing calorie intake and making food choices that are low in sodium, saturated fat, trans fat, and cholesterol are recommended to manage obesity.  Maintain a healthy weight.  Stay physically active. It is recommended that you get at least 30 minutes of activity on all or most days.  Do not use any tobacco products including cigarettes, chewing tobacco, or electronic cigarettes.  Limit alcohol use even if you are not taking warfarin. Moderate alcohol use is considered to be:  No more than 2 drinks each day for men.  No more than 1 drink each day for nonpregnant women.  Home safety. A safe home environment is important to reduce the risk of falls. Your health care provider may  arrange for specialists to evaluate your home. Having grab bars in the bedroom and bathroom is often important. Your health care provider may arrange for equipment to be used at home, such as raised toilets and a seat for the shower.  Physical, occupational, and speech therapy. Ongoing therapy may be needed to maximize your recovery after a stroke. If you have been advised to use a walker or a cane, use it at all times. Be sure to keep your therapy appointments.  Follow all instructions for follow-up with your health care provider. This is very important. This includes any referrals, physical therapy, rehabilitation, and lab tests. Proper follow-up can prevent another stroke from occurring. SEEK MEDICAL CARE IF:  You have personality changes.  You have difficulty swallowing.  You are seeing double.  You have dizziness.  You have a fever.  You have skin breakdown. SEEK IMMEDIATE MEDICAL CARE IF:  Any of these symptoms may represent a serious problem that is an emergency. Do not wait to see if the symptoms will go away. Get medical help right away. Call your local emergency services (911 in [place] Do not  drive yourself to the hospital.  You have sudden weakness or numbness of the face, arm, or leg, especially on one side of the body.  You have sudden trouble walking or difficulty moving arms or legs.  You have sudden confusion.  You have trouble speaking (aphasia) or understanding.  You have sudden trouble seeing in one or both eyes.  You have a loss of balance or coordination.  You have a sudden, severe headache with no known cause.  You have new chest pain or an irregular heartbeat.  You have a partial or total loss of consciousness. Document Released: 05/10/2005 Document Revised: 09/24/2013 Document Reviewed: 12/19/2011 [facility] Patient Information 2015 [place], [place]. This information is not intended to replace advice given to you by your health care provider. Make sure you  discuss any questions you have with your health care provider.

## 2014-06-28 ENCOUNTER — Ambulatory Visit: Payer: BLUE CROSS/BLUE SHIELD

## 2014-06-28 ENCOUNTER — Encounter: Payer: Self-pay | Admitting: Physical Therapy

## 2014-06-28 ENCOUNTER — Ambulatory Visit: Payer: BLUE CROSS/BLUE SHIELD | Admitting: Physical Therapy

## 2014-06-28 DIAGNOSIS — R269 Unspecified abnormalities of gait and mobility: Secondary | ICD-10-CM | POA: Diagnosis not present

## 2014-06-28 DIAGNOSIS — R41841 Cognitive communication deficit: Secondary | ICD-10-CM

## 2014-06-28 DIAGNOSIS — IMO0002 Reserved for concepts with insufficient information to code with codable children: Secondary | ICD-10-CM

## 2014-06-28 DIAGNOSIS — R531 Weakness: Secondary | ICD-10-CM

## 2014-06-28 NOTE — Therapy (Signed)
[facility] [address] [place], [place], [address] Phone: [phone]   Fax:  [phone]  Speech Language Pathology Evaluation  Patient Details  Name: Kelsey Mendez MRN: [id number] Date of Birth: Oct 13, 1955 Referring Provider:  Wilson Singer, MD  Encounter Date: 06/28/2014      End of Session - 06/28/14 1704    Visit Number 1   Number of Visits 16   Date for SLP Re-Evaluation 08/27/14   SLP Start Time 1450   SLP Stop Time  1530   SLP Time Calculation (min) 40 min   Activity Tolerance Patient tolerated treatment well      Past Medical History  Diagnosis Date  . Stroke   . Hypertension   . Osteoporosis     per patient    Past Surgical History  Procedure Laterality Date  . Cesarean section    . Tonsillectomy    . Hernia repair      There were no vitals taken for this visit.  Visit Diagnosis: Cognitive communication deficit - Plan: SLP plan of care cert/re-cert        SLP Evaluation [facility] - 06/28/14 1451    SLP Visit Information   SLP Received On 06/28/14   Onset Date 01-16-13   Medical Diagnosis CVA   Subjective   Subjective "I  have a lot of cognitive problems. They gave me a lot of tests in [place]."Pt drove here today.   Pain Assessment   Currently in Pain? No/denies   General Information   HPI Pt with CVA in August 2014. Disability March 2015, moved to [facility] July 2015.    Prior Functional Status   Cognitive/Linguistic Baseline Baseline deficits   Education 4 year degree   Vocation On disability   Cognition   Overall Cognitive Status Impaired/Different from baseline   Area of Impairment Memory;Attention  Pt relates decr'd divided attention   Attention Comments reports "I have to do one thing at a time now"   Attention Focused;Sustained;Selective   Focused Attention Appears intact   Sustained Attention Appears intact   Selective Attention Appears intact   Memory Impaired   Memory Impairment Retrieval deficit;Decreased short term memory   Verbal Expression   Overall Verbal Expression Appears within functional limits for tasks assessed   Standardized Assessments   Standardized Assessments  Montreal Cognitive Assessment (MOCA)   Montreal Cognitive Assessment (MOCA)  22/30               SLP Short Term Goals - 06/28/14 1707    SLP SHORT TERM GOAL #1   Title pt will demo compensations for memory in complex linguistic tasks with rare min cues   Time 4   Period Weeks   Status New   SLP SHORT TERM GOAL #2   Title pt will demo compensations for attention in complex linguistic tasks with rare min cues   Time 4   Period Weeks   Status New   SLP SHORT TERM GOAL #3   Title pt will achieve 85% success in tasks of simple linguistic executive function   Time 4   Period Weeks   Status New          SLP Long Term Goals - 06/28/14 1712    SLP LONG TERM GOAL #1   Title demo memory compensations in complex linguistic tasks   Time 8   Period Weeks   Status New   SLP LONG TERM GOAL #2   Title demo compensations for  attention in complex linguistic tasks   Time 8   Period Weeks   Status New   SLP LONG TERM GOAL #3   Title demo appropriate skills in executive function for 95% success in linguistic tasks   Time 8   Period Weeks   Status New          Plan - 06/28/14 1704    Clinical Impression Statement Pt presents with mild-mod cognitive deficit hindering vocational opportunities due to CVA in August 2014. Pt will be seen for 4 weeks and if notable progress made will cont for another 4 weeks. Pt used compensations for memory today during eval.   Speech Therapy Frequency 2x / week   Duration --  8 weeks, d/c after 4 weeks possible   Treatment/Interventions Environmental controls;Internal/external aids;Compensatory strategies;SLP instruction and feedback;Patient/family education;Functional tasks;Cueing hierarchy   Potential to Achieve Goals Fair    Potential Considerations Severity of impairments        Problem List There are no active problems to display for this patient.   [facility] The University [facility] PennsylvaniaCHINKE,Kinesha Auten, SLP 06/28/2014, 5:16 PM  [facility] [address] [place], [place], [address] Phone: [phone]   Fax:  [phone]

## 2014-06-28 NOTE — Therapy (Signed)
[facility] [address] [place], [place], [address] Phone: [phone]   Fax:  [phone]  Physical Therapy Treatment  Patient Details  Name: Kelsey Mendez MRN: [id number] Date of Birth: 07-18-1955 Referring Provider:  Wilson Singer, MD  Encounter Date: 06/28/2014      PT End of Session - 06/28/14 1406    Visit Number 5   Number of Visits 17   Date for PT Re-Evaluation 07/14/14   Authorization Type [employer]   PT Start Time 1401   PT Stop Time 1441   PT Time Calculation (min) 40 min   Equipment Utilized During Treatment Gait belt   Activity Tolerance Patient tolerated treatment well   Behavior During Therapy [facility] for tasks assessed/performed      Past Medical History  Diagnosis Date  . Stroke   . Hypertension   . Osteoporosis     per patient    Past Surgical History  Procedure Laterality Date  . Cesarean section    . Tonsillectomy    . Hernia repair      There were no vitals taken for this visit.  Visit Diagnosis:  Abnormality of gait  Generalized weakness  Lack of coordination due to stroke      Subjective Assessment - 06/28/14 1405    Symptoms No new complaints. No falls or pain to report.   Currently in Pain? No/denies   Pain Score 0-No pain     Treatment: Neuro Re-ed - on red/blue mats at counter: high knee marches, toe walk, heel walk and tandem walk, all forward/backward, x 3 laps each/each way with min guard assist to min assist for balance.  - blue mat with tall cones: alternating forward toe tap, cross toe taps, forward double toe taps, cross double toe taps, flip over/up and cross-forward toe taps, x 10 each bil legs with min assist for balance.        PT Short Term Goals - 06/18/14 1707    PT SHORT TERM GOAL #1   Title Pt will be independent in HEP to improve functional mobility. Target date: 06/12/14.   Status On-going   PT SHORT TERM GOAL #2   Title Perform BERG and  write STG and LTG. Target date: 06/12/14.   Status Achieved   PT SHORT TERM GOAL #3   Title Pt will improve TUG time to < 13 seconds to decrease falls risk. Target date: 06/12/14.   Status Achieved   PT SHORT TERM GOAL #4   Title Pt will report no falls in the last two weeks to improve safety. Target date: 06/12/14.   Status Achieved   PT SHORT TERM GOAL #5   Title Pt will ambulate 300' over even/uneven terrain with supervision to improve functional mobility. Target date: 06/12/14.   Status Achieved           PT Long Term Goals - 06/20/14 1529    PT LONG TERM GOAL #1   Title Pt will verbalize agreement and understanding of CVA symptoms/signs to decrease risk for future CVA. Target date: 07/10/14.   Status On-going   PT LONG TERM GOAL #2   Title Have pt complete FOTO and write appropriate goal. Target date: 07/10/14.   Status Achieved   PT LONG TERM GOAL #3   Title Pt will ambulate 600' over even/uneven terrain, independently, to improve functional mobility. Target date: 07/10/14.   Status On-going   PT LONG TERM GOAL #4   Title Pt will verbalize plans  to join fitness center after PT d/c to improve overall fitness. Target date: 07/10/14.   Status On-going   PT LONG TERM GOAL #5   Title Pt will improve BERG balance score to >/=51/56 to reduce falls risk. Target date: 07/10/14.   Status On-going   PT LONG TERM GOAL #6   Title Pt will improve SIS-mobility score by 10% to improve quality of life. Target date: 07/10/14.   Baseline 36.1%   Status On-going           Plan - 06/28/14 1406    Clinical Impression Statement Pt making steady progress toward goals. Does fatigue quickly with higher level balance activities, however recovers quickly as well.   Pt will benefit from skilled therapeutic intervention in order to improve on the following deficits Abnormal gait;Impaired flexibility;Decreased coordination;Decreased endurance;Impaired sensation;Obesity;Decreased knowledge of use of  DME;Decreased balance;Decreased strength;Decreased mobility   Rehab Potential Good   PT Frequency 2x / week   PT Duration 8 weeks   PT Treatment/Interventions ADLs/Self Care Home Management;Gait training;Neuromuscular re-education;Stair training;Biofeedback;Functional mobility training;Patient/family education;Therapeutic activities;Cryotherapy;Electrical Stimulation;Therapeutic exercise;Manual techniques;Balance training;DME Instruction   PT Next Visit Plan Continue to progress dynamic gait/balance training.    PT Home Exercise Plan Balance and strengthening HEP.   Consulted and Agree with Plan of Care Patient        Problem List There are no active problems to display for this patient.   Sallyanne KusterBury, Kelsey Mendez 06/28/2014, 6:02 PM  Sallyanne KusterKathy Daquawn Mendez, PTA, [facility] [address], [address] [place], [place] [address] [phone] 06/28/2014, 6:02 PM

## 2014-07-02 ENCOUNTER — Ambulatory Visit: Payer: BLUE CROSS/BLUE SHIELD | Admitting: Speech Pathology

## 2014-07-02 ENCOUNTER — Ambulatory Visit: Payer: BLUE CROSS/BLUE SHIELD

## 2014-07-02 DIAGNOSIS — R269 Unspecified abnormalities of gait and mobility: Secondary | ICD-10-CM

## 2014-07-02 DIAGNOSIS — R531 Weakness: Secondary | ICD-10-CM

## 2014-07-02 DIAGNOSIS — R41841 Cognitive communication deficit: Secondary | ICD-10-CM

## 2014-07-02 DIAGNOSIS — IMO0002 Reserved for concepts with insufficient information to code with codable children: Secondary | ICD-10-CM

## 2014-07-02 NOTE — Therapy (Signed)
[facility] [address] [place], [place], [address] Phone: [phone]   Fax:  [phone]  Physical Therapy Treatment  Patient Details  Name: Kelsey DrapeKathryn Mendez MRN: [id number] Date of Birth: Jan 23, 1956 Referring Provider:  Wilson SingerGosrani, Nimish C, MD  Encounter Date: 07/02/2014      PT End of Session - 07/02/14 1632    Visit Number 6   Number of Visits 17   Date for PT Re-Evaluation 07/14/14   Authorization Type UHC   PT Start Time 1411   PT Stop Time 1444   PT Time Calculation (min) 33 min   Equipment Utilized During Treatment Gait belt   Activity Tolerance Patient tolerated treatment well   Behavior During Therapy [facility] for tasks assessed/performed      Past Medical History  Diagnosis Date  . Stroke   . Hypertension   . Osteoporosis     per patient    Past Surgical History  Procedure Laterality Date  . Cesarean section    . Tonsillectomy    . Hernia repair      There were no vitals taken for this visit.  Visit Diagnosis:  Abnormality of gait  Generalized weakness  Lack of coordination due to stroke      Subjective Assessment - 07/02/14 1412    Symptoms Pt arrived 10 minutes late. Pt denied falls or changes since last visit.    Patient Stated Goals Strengthen R side of body, loose weight to increase endurance and decrease risk of diabetes, walk without falling, to have better balance on steps/walking   Currently in Pain? Yes   Pain Score 3    Pain Location Leg   Pain Orientation Right   Pain Descriptors / Indicators Aching   Pain Type Chronic pain   Pain Onset More than a month ago   Pain Frequency Constant   Aggravating Factors  lying on R LE   Pain Relieving Factors rest   Multiple Pain Sites Yes   Pain Score 4   Pain Type Acute pain   Pain Location Head  headache   Pain Orientation Mid   Pain Descriptors / Indicators Aching   Pain Frequency Several days a week               Neuro re-ed: -Cone taps (single, double, triple, and cross-overs) perform on non-compliant surface, blue mat, and blue foam beam with B LEs. 2x6/LE/activity. Performed with supervision to min guard, but pt did require min A during taps while standing on blue beam due to impaired balance. VC's to improve weight shifting and upright posture. -Bean bag toss x15 while standing on blue beam, pt reached to R side and L side to pick up bean bag and then tossed forward into basket. Pt progressed from min A-mod A to min guard. VC's to activate core and gluts to maintain balance. -4" and 6" step-ups 1x5/LE on each step height. VC's to improve anterior weight shifting.  Pt required 3 seated rest breaks due to fatigue.               PT Short Term Goals - 06/18/14 1707    PT SHORT TERM GOAL #1   Title Pt will be independent in HEP to improve functional mobility. Target date: 06/12/14.   Status On-going   PT SHORT TERM GOAL #2   Title Perform BERG and write STG and LTG. Target date: 06/12/14.   Status Achieved   PT SHORT TERM GOAL #3   Title  Pt will improve TUG time to < 13 seconds to decrease falls risk. Target date: 06/12/14.   Status Achieved   PT SHORT TERM GOAL #4   Title Pt will report no falls in the last two weeks to improve safety. Target date: 06/12/14.   Status Achieved   PT SHORT TERM GOAL #5   Title Pt will ambulate 300' over even/uneven terrain with supervision to improve functional mobility. Target date: 06/12/14.   Status Achieved           PT Long Term Goals - 06/20/14 1529    PT LONG TERM GOAL #1   Title Pt will verbalize agreement and understanding of CVA symptoms/signs to decrease risk for future CVA. Target date: 07/10/14.   Status On-going   PT LONG TERM GOAL #2   Title Have pt complete FOTO and write appropriate goal. Target date: 07/10/14.   Status Achieved   PT LONG TERM GOAL #3   Title Pt will ambulate 600' over even/uneven terrain,  independently, to improve functional mobility. Target date: 07/10/14.   Status On-going   PT LONG TERM GOAL #4   Title Pt will verbalize plans to join fitness center after PT d/c to improve overall fitness. Target date: 07/10/14.   Status On-going   PT LONG TERM GOAL #5   Title Pt will improve BERG balance score to >/=51/56 to reduce falls risk. Target date: 07/10/14.   Status On-going   PT LONG TERM GOAL #6   Title Pt will improve SIS-mobility score by 10% to improve quality of life. Target date: 07/10/14.   Baseline 36.1%   Status On-going               Plan - 07/02/14 1632    Clinical Impression Statement Pt continues to demonstrate progress towards goals. Pt required min-mod A during LOB while performing dynamic balance activities on foam beam but progressed to min guard. Pt continues to require seated rest breaks due to fatigue. Pt would continue to benefit from skilled PT to improve safety during functional mobilty.   Pt will benefit from skilled therapeutic intervention in order to improve on the following deficits Abnormal gait;Impaired flexibility;Decreased coordination;Decreased endurance;Impaired sensation;Obesity;Decreased knowledge of use of DME;Decreased balance;Decreased strength;Decreased mobility   Rehab Potential Good   PT Frequency 2x / week   PT Duration 8 weeks   PT Treatment/Interventions ADLs/Self Care Home Management;Gait training;Neuromuscular re-education;Stair training;Biofeedback;Functional mobility training;Patient/family education;Therapeutic activities;Cryotherapy;Electrical Stimulation;Therapeutic exercise;Manual techniques;Balance training;DME Instruction   PT Next Visit Plan Continue to progress dynamic gait/balance training.    PT Home Exercise Plan Balance and strengthening HEP.   Consulted and Agree with Plan of Care Patient        Problem List There are no active problems to display for this patient.   Miyu Fenderson L 07/02/2014, 4:36  PM  [facility] [address] [place], [place], [address] Phone: [phone]   Fax:  [phone]     Zerita Boers, PT,DPT 07/02/2014 4:36 PM Phone: [phone] Fax: [phone]

## 2014-07-02 NOTE — Therapy (Signed)
[facility] [address] [place], [place], [address] Phone: [phone]   Fax:  [phone]  Speech Language Pathology Treatment  Patient Details  Name: Kelsey DrapeKathryn Mies MRN: [id number] Date of Birth: [birth year] Referring Provider:  Wilson SingerGosrani, Nimish C, MD  Encounter Date: 07/02/2014      End of Session - 07/02/14 1145    SLP Start Time 1101   SLP Stop Time  1146   SLP Time Calculation (min) 45 min      Past Medical History  Diagnosis Date  . Stroke   . Hypertension   . Osteoporosis     per patient    Past Surgical History  Procedure Laterality Date  . Cesarean section    . Tonsillectomy    . Hernia repair      There were no vitals taken for this visit.  Visit Diagnosis: Cognitive communication deficit      Subjective Assessment - 07/02/14 1106    Symptoms "I have been doing well"             ADULT SLP TREATMENT - 07/02/14 1108    General Information   Behavior/Cognition Alert;Cooperative   Treatment Provided   Treatment provided Cognitive-Linquistic   Pain Assessment   Pain Assessment 0-10   Pain Score 3    Pain Location right leg   Pain Descriptors / Indicators Aching;Headache   Pain Intervention(s) Monitored during session   Cognitive-Linquistic Treatment   Treatment focused on Cognition   Skilled Treatment Pt denies forgetting to take meds. Ms. Smith Roberteamer reports using calendar for appointments, bill due dates, Pt has cork board/magnetic board for grocery lists. Pharmacy sends her a text message for when refills are due. She reports some difficulty organizing bills. Pt instructed to get according files to organize bills. Pt also to make a list of monthly bills and write due dates for each. Pt verbalized compensations for attention with occaassional mod A.  Pt attended to simple cognitive linguistic tasks with moderate distraction with  occassional min A - organizing a party.    Assessment /  Recommendations / Plan   Plan Continue with current plan of care   Progression Toward Goals   Progression toward goals Progressing toward goals          SLP Education - 07/02/14 1138    Education provided Yes   Education Details compensations for memory and attention           SLP Short Term Goals - 07/02/14 1145    SLP SHORT TERM GOAL #1   Title pt will demo compensations for memory in complex linguistic tasks with rare min cues   Time 4   Period Weeks   Status New   SLP SHORT TERM GOAL #2   Title pt will demo compensations for attention in complex linguistic tasks with rare min cues   Time 4   Period Weeks   Status New   SLP SHORT TERM GOAL #3   Title pt will achieve 85% success in tasks of simple linguistic executive function   Time 4   Period Weeks   Status New          SLP Long Term Goals - 07/02/14 1145    SLP LONG TERM GOAL #1   Title demo memory compensations in complex linguistic tasks   Time 8   Period Weeks   Status New   SLP LONG TERM GOAL #2   Title demo compensations for attention in complex linguistic  tasks   Time 8   Period Weeks   Status New   SLP LONG TERM GOAL #3   Title demo appropriate skills in executive function for 95% success in linguistic tasks   Time 8   Period Weeks   Status New          Plan - 07/02/14 1143    Clinical Impression Statement Pt requried occassional min A for selected attention in distracting environment. Occasional min A for organization taskst. Recommend continue skilled ST.    Speech Therapy Frequency 2x / week   Treatment/Interventions Environmental controls;Internal/external aids;Compensatory strategies;SLP instruction and feedback;Patient/family education;Functional tasks;Cueing hierarchy   Potential to Achieve Goals Fair   Potential Considerations Severity of impairments   Consulted and Agree with Plan of Care Patient        Problem List There are no active problems to display for this  patient.   Lovvorn, Radene Journey , SLP  07/02/2014, 11:46 AM  [facility] [address] [place], [place], [address] Phone: [phone]   Fax:  [phone]

## 2014-07-02 NOTE — Patient Instructions (Signed)
  Memory Strategies  W - Write it down  A - Associate it with something  R - Repeat it  M - Mental Image     Play the memory game  Try to remember 3-5 items on your store list without looking  Study a detailed picture in a magazine for 1 minute, then write down everything you can remember from the picture  ATTENTION STRATEGIES::  1. Limit distractions - no TV, noise, conversations, etc 2. Do complex tasks when you are most awake/alert 3. Recheck all of your work   Data processing managerAccordian file for bills - file paper bills, ID, passwords Write list of bills and general due dates for front of file box

## 2014-07-04 ENCOUNTER — Ambulatory Visit: Payer: BLUE CROSS/BLUE SHIELD

## 2014-07-04 ENCOUNTER — Ambulatory Visit: Payer: BLUE CROSS/BLUE SHIELD | Admitting: Speech Pathology

## 2014-07-09 ENCOUNTER — Ambulatory Visit: Payer: BLUE CROSS/BLUE SHIELD

## 2014-07-11 ENCOUNTER — Ambulatory Visit: Payer: BLUE CROSS/BLUE SHIELD

## 2014-07-11 DIAGNOSIS — R269 Unspecified abnormalities of gait and mobility: Secondary | ICD-10-CM

## 2014-07-11 DIAGNOSIS — R531 Weakness: Secondary | ICD-10-CM

## 2014-07-11 DIAGNOSIS — IMO0002 Reserved for concepts with insufficient information to code with codable children: Secondary | ICD-10-CM

## 2014-07-11 DIAGNOSIS — R41841 Cognitive communication deficit: Secondary | ICD-10-CM

## 2014-07-11 NOTE — Therapy (Signed)
[facility] [address] [place], [place], [address] Phone: [phone]   Fax:  [phone]  Speech Language Pathology Treatment  Patient Details  Name: Edeline Greening MRN: [id number] Date of Birth: [birth year] Referring Provider:  Wilson Singer, MD  Encounter Date: 07/11/2014      End of Session - 07/11/14 1356    Visit Number 3   Number of Visits 16   Date for SLP Re-Evaluation 08/27/14   SLP Start Time 1316   SLP Stop Time  1400   SLP Time Calculation (min) 44 min   Activity Tolerance Patient tolerated treatment well      Past Medical History  Diagnosis Date  . Stroke   . Hypertension   . Osteoporosis     per patient    Past Surgical History  Procedure Laterality Date  . Cesarean section    . Tonsillectomy    . Hernia repair      There were no vitals taken for this visit.  Visit Diagnosis: Cognitive communication deficit      Subjective Assessment - 07/11/14 1322    Symptoms "Did you do ok with the snow?" Pt told SLP re: her son's accident             ADULT SLP TREATMENT - 07/11/14 1323    General Information   Behavior/Cognition Alert;Cooperative;Pleasant mood   Treatment Provided   Treatment provided Cognitive-Linquistic   Pain Assessment   Pain Assessment 0-10   Pain Score 2    Pain Location headache   Pain Descriptors / Indicators Dull;Headache   Pain Intervention(s) Monitored during session   Cognitive-Linquistic Treatment   Treatment focused on Cognition   Skilled Treatment Pt recalled to give homework to SLP. Pt was guided through cont'd executive function task. Pt req'd extra time, but without cues. SLP reviewed paying bills with pt, as she told SLP she does in fact have difficulty have trouble organizing bills. Pt recalled she needed to buy accordian folder and forgot to do so. She needed cues to be more detailed with her written notes due to errors in memory for  executive function task, rarely.    Assessment / Recommendations / Plan   Plan Continue with current plan of care   Progression Toward Goals   Progression toward goals Progressing toward goals            SLP Short Term Goals - 07/11/14 1358    SLP SHORT TERM GOAL #1   Title pt will demo compensations for memory in complex linguistic tasks with rare min cues   Time 3   Period Weeks   Status On-going   SLP SHORT TERM GOAL #2   Title pt will demo compensations for attention in complex linguistic tasks with rare min cues   Time 3   Period Weeks   Status On-going   SLP SHORT TERM GOAL #3   Title pt will achieve 85% success in tasks of simple linguistic executive function   Time 3   Period Weeks   Status On-going          SLP Long Term Goals - 07/11/14 1358    SLP LONG TERM GOAL #1   Title demo memory compensations in complex linguistic tasks   Time 7   Period Weeks   Status On-going   SLP LONG TERM GOAL #2   Title demo compensations for attention in complex linguistic tasks   Time 7   Period Weeks  Status On-going   SLP LONG TERM GOAL #3   Title demo appropriate skills in executive function for 95% success in linguistic tasks   Time 7   Period Weeks   Status On-going          Plan - 07/11/14 1356    Clinical Impression Statement Skilled ST remains necessary to assist pt in her independence with executive function, attention and memory compensation in order to return to the workforce.   Speech Therapy Frequency 2x / week   Duration --  7 weeks   Treatment/Interventions Environmental controls;Internal/external aids;Compensatory strategies;SLP instruction and feedback;Patient/family education;Functional tasks;Cueing hierarchy   Potential to Achieve Goals Fair   Potential Considerations Severity of impairments        Problem List There are no active problems to display for this patient.   DearbornSCHINKE,Rafi Kenneth, SLP 07/11/2014, 1:59 PM  [facility] [address] [place], [place], [address] Phone: [phone]   Fax:  [phone]

## 2014-07-12 NOTE — Therapy (Signed)
[facility] [address] [place], [place], [address] Phone: [phone]   Fax:  [phone]  Physical Therapy Treatment  Patient Details  Name: Kelsey Mendez MRN: [id number] Date of Birth: [birth year] Referring Provider:  Doree Albee, MD  Encounter Date: 07/11/2014      PT End of Session - 07/12/14 1127    Visit Number 7   Number of Visits 17   Date for PT Re-Evaluation 07/14/14   Authorization Type UHC   PT Start Time 1402   PT Stop Time 1441   PT Time Calculation (min) 39 min   Activity Tolerance Patient tolerated treatment well   Behavior During Therapy [facility] for tasks assessed/performed      Past Medical History  Diagnosis Date  . Stroke   . Hypertension   . Osteoporosis     per patient    Past Surgical History  Procedure Laterality Date  . Cesarean section    . Tonsillectomy    . Hernia repair      There were no vitals taken for this visit.  Visit Diagnosis:  Abnormality of gait  Generalized weakness  Lack of coordination due to stroke      Subjective Assessment - 07/11/14 1405    Symptoms Pt denied falls or changes since last visit. Pt is pleased with her progress and agrees with d/c from PT.   Patient Stated Goals Strengthen R side of body, lose weight to increase endurance and decrease risk of diabetes, walk without falling, to have better balance on steps/walking   Currently in Pain? Yes   Pain Score 2    Pain Location Leg   Pain Orientation Right   Pain Descriptors / Indicators Aching;Dull   Pain Type Chronic pain   Pain Onset More than a month ago   Pain Frequency Intermittent   Aggravating Factors  Lying on R LE   Pain Relieving Factors rest   Multiple Pain Sites Yes   Pain Score 2   Pain Type Chronic pain;Acute pain   Pain Location Head  Headache   Pain Orientation Mid   Pain Radiating Towards Pt reported headache comes and goes.   Pain Descriptors / Indicators  Aching   Pain Frequency Several days a week                    [facility] Adult PT Treatment/Exercise - 07/11/14 1408    Ambulation/Gait   Ambulation/Gait Yes   Ambulation/Gait Assistance 7: Independent   Ambulation/Gait Assistance Details Pt ambulated over even, rubber mulch, gravel and uneven terrain without LOB. Pt demonstrated safe technique.   Ambulation Distance (Feet) 650 Feet   Assistive device None   Gait Pattern Step-through pattern;Decreased dorsiflexion - left;Trendelenburg  Decreased DF during last 102' of ambulation.   Curb 7: Independent   Gait Comments Pt ascendend/descended curb with safe technique and no LOB.   Standardized Balance Assessment   Standardized Balance Assessment Berg Balance Test   Berg Balance Test   Sit to Stand Able to stand without using hands and stabilize independently   Standing Unsupported Able to stand safely 2 minutes   Sitting with Back Unsupported but Feet Supported on Floor or Stool Able to sit safely and securely 2 minutes   Stand to Sit Sits safely with minimal use of hands   Transfers Able to transfer safely, minor use of hands   Standing Unsupported with Eyes Closed Able to stand 10 seconds safely   Standing Ubsupported  with Feet Together Able to place feet together independently and stand 1 minute safely   From Standing, Reach Forward with Outstretched Arm Can reach confidently >25 cm (10")  12"   From Standing Position, Pick up Object from [place] to pick up shoe safely and easily   From Standing Position, Turn to Look Behind Over each Shoulder Looks behind from both sides and weight shifts well   Turn 360 Degrees Able to turn 360 degrees safely in 4 seconds or less   Standing Unsupported, Alternately Place Feet on Step/Stool Able to stand independently and safely and complete 8 steps in 20 seconds   Standing Unsupported, One Foot in [place] to place foot tandem independently and hold 30 seconds   Standing on One Leg Able  to lift leg independently and hold > 10 seconds   Total Score 56                PT Education - 07/12/14 1125    Education provided Yes   Education Details PT reviewed HEP with pt. Pt was also able to state 3-4 risk factors and signs/symptoms of CVA. PT educated pt on the importance of continuing HEP after PT d/c and to join a fitness center. Pt reported she currently does [employer] with [employer] at home but she plans to join [employer] upon PT d/c.   Person(s) Educated Patient   Methods Explanation;Handout   Comprehension Verbalized understanding          PT Short Term Goals - 07/12/14 1128    PT SHORT TERM GOAL #1   Title Pt will be independent in HEP to improve functional mobility. Target date: 06/12/14.   Status Achieved   PT SHORT TERM GOAL #2   Title Perform BERG and write STG and LTG. Target date: 06/12/14.   Status Achieved   PT SHORT TERM GOAL #3   Title Pt will improve TUG time to < 13 seconds to decrease falls risk. Target date: 06/12/14.   Status Achieved   PT SHORT TERM GOAL #4   Title Pt will report no falls in the last two weeks to improve safety. Target date: 06/12/14.   Status Achieved   PT SHORT TERM GOAL #5   Title Pt will ambulate 300' over even/uneven terrain with supervision to improve functional mobility. Target date: 06/12/14.   Status Achieved           PT Long Term Goals - 07/12/14 1128    PT LONG TERM GOAL #1   Title Pt will verbalize agreement and understanding of CVA symptoms/signs to decrease risk for future CVA. Target date: 07/10/14.   Status Achieved   PT LONG TERM GOAL #2   Title Have pt complete FOTO and write appropriate goal. Target date: 07/10/14.   Status Achieved   PT LONG TERM GOAL #3   Title Pt will ambulate 600' over even/uneven terrain, independently, to improve functional mobility. Target date: 07/10/14.   Status Achieved   PT LONG TERM GOAL #4   Title Pt will verbalize plans to join fitness center after PT d/c to  improve overall fitness. Target date: 07/10/14.   Status Achieved   PT LONG TERM GOAL #5   Title Pt will improve BERG balance score to >/=51/56 to reduce falls risk. Target date: 07/10/14.   Status Achieved   PT LONG TERM GOAL #6   Title Pt will improve SIS-mobility score by 10% to improve quality of life. Target date: 07/10/14.  Baseline 36.1%   Status Achieved               Plan - 07/12/14 1127    Clinical Impression Statement Pt is discharging from PT, please see PT d/c summary for details.        Problem List There are no active problems to display for this patient.  PHYSICAL THERAPY DISCHARGE SUMMARY  Visits from Start of Care: 7  Current functional level related to goals / functional outcomes:     PT Long Term Goals - 07/12/14 1128    PT LONG TERM GOAL #1   Title Pt will verbalize agreement and understanding of CVA symptoms/signs to decrease risk for future CVA. Target date: 07/10/14.   Status Achieved   PT LONG TERM GOAL #2   Title Have pt complete FOTO and write appropriate goal. Target date: 07/10/14.   Status Achieved   PT LONG TERM GOAL #3   Title Pt will ambulate 600' over even/uneven terrain, independently, to improve functional mobility. Target date: 07/10/14.   Status Achieved   PT LONG TERM GOAL #4   Title Pt will verbalize plans to join fitness center after PT d/c to improve overall fitness. Target date: 07/10/14.   Status Achieved   PT LONG TERM GOAL #5   Title Pt will improve BERG balance score to >/=51/56 to reduce falls risk. Target date: 07/10/14.   Status Achieved   PT LONG TERM GOAL #6   Title Pt will improve SIS-mobility score by 10% to improve quality of life. Target date: 07/10/14.   Baseline 36.1%   Status Achieved        Remaining deficits: Muscle weakness, which will continue to improve with performance of HEP and joining fitness center.   Education / Equipment: HEP, CVA education, Therapist, nutritional  Plan: Patient agrees to  discharge.  Patient goals were met. Patient is being discharged due to meeting the stated rehab goals.  ?????       Breindy Meadow L 07/12/2014, 11:31 AM  [facility] [address] [place], [place], [address] Phone: [phone]   Fax:  [phone]     Geoffry Paradise, PT,DPT 07/12/2014 11:31 AM Phone: [phone] Fax: [phone]

## 2014-07-16 ENCOUNTER — Ambulatory Visit: Payer: BLUE CROSS/BLUE SHIELD | Admitting: Speech Pathology

## 2014-07-16 DIAGNOSIS — R41841 Cognitive communication deficit: Secondary | ICD-10-CM

## 2014-07-16 DIAGNOSIS — R269 Unspecified abnormalities of gait and mobility: Secondary | ICD-10-CM | POA: Diagnosis not present

## 2014-07-16 NOTE — Patient Instructions (Signed)
Bring in neuropsych eval  Research/plan/organize trip

## 2014-07-16 NOTE — Therapy (Signed)
[facility] [address] [place], [place], [address] Phone: [phone]   Fax:  [phone]  Speech Language Pathology Treatment  Patient Details  Name: Kelsey Mendez MRN: [id number] Date of Birth: 05/25/1955 Referring Provider:  Wilson SingerGosrani, Nimish C, MD  Encounter Date: 07/16/2014      End of Session - 07/16/14 1315    Visit Number 4   Number of Visits 16   Date for SLP Re-Evaluation 08/27/14   SLP Start Time 1232   SLP Stop Time  1315   SLP Time Calculation (min) 43 min   Activity Tolerance Patient tolerated treatment well      Past Medical History  Diagnosis Date  . Stroke   . Hypertension   . Osteoporosis     per patient    Past Surgical History  Procedure Laterality Date  . Cesarean section    . Tonsillectomy    . Hernia repair      There were no vitals taken for this visit.  Visit Diagnosis: Cognitive communication deficit      Subjective Assessment - 07/16/14 1236    Symptoms "I've been in the hosptial with my son's accident and my mom"             ADULT SLP TREATMENT - 07/16/14 1237    General Information   Behavior/Cognition Alert;Cooperative;Pleasant mood   Treatment Provided   Treatment provided Cognitive-Linquistic   Pain Assessment   Pain Assessment 0-10   Pain Score 2    Pain Location head   Pain Descriptors / Indicators Aching   Pain Intervention(s) Monitored during session   Cognitive-Linquistic Treatment   Treatment focused on Cognition   Skilled Treatment Pt recalled to bring in her budget plan, used memory notepad to double check if she had more homework.  Budget for monthly bills on organized chart, pt reports she has charts for each month through Dec 2016. She fills out each month, even if card has no balance.  Pt reports that she bought accordian file but has not organized it yet due to her mom being in the hospital. Notebook continues to be disorganized with pt  having trouble locating notes.  Instructed pt to write day and date for each day's note to help her locate notes/info she needs.  Executive function tasks - organize/plan vacation "[place]" - pt generated a list of information needed to plan her trip with ocassional questioning cues for neccessary details in trip planning.  homework to use internet and research trip flights, hotels, Clinical research associateitenerary etc.    Assessment / Recommendations / Plan   Plan Continue with current plan of care   Progression Toward Goals   Progression toward goals Progressing toward goals          SLP Education - 07/16/14 1314    Education provided Yes   Education Details Organization of notes - date/day each page/note to help her locate info in notebook   Person(s) Educated Patient   Methods Explanation   Comprehension Verbalized understanding          SLP Short Term Goals - 07/16/14 1316    SLP SHORT TERM GOAL #1   Title pt will demo compensations for memory in complex linguistic tasks with rare min cues   Time 2   Period Weeks   Status On-going   SLP SHORT TERM GOAL #2   Title pt will demo compensations for attention in complex linguistic tasks with rare min cues   Time 2  Period Weeks   Status On-going   SLP SHORT TERM GOAL #3   Title pt will achieve 85% success in tasks of simple linguistic executive function   Time 2   Period Weeks   Status On-going          SLP Long Term Goals - 07/16/14 1317    SLP LONG TERM GOAL #1   Title demo memory compensations in complex linguistic tasks   Time 6   Period Weeks   Status On-going   SLP LONG TERM GOAL #2   Title demo compensations for attention in complex linguistic tasks   Time 6   Period Weeks   Status On-going   SLP LONG TERM GOAL #3   Title demo appropriate skills in executive function for 95% success in linguistic tasks   Time 6   Period Weeks   Status On-going          Plan - 07/16/14 1315    Clinical Impression Statement Skilled ST  remains necessary to assist pt in her independence with executive function, attention and memory compensation in order to return to the workforce.   Speech Therapy Frequency 2x / week   Treatment/Interventions Environmental controls;Internal/external aids;Compensatory strategies;SLP instruction and feedback;Patient/family education;Functional tasks;Cueing hierarchy   Potential to Achieve Goals Fair   Consulted and Agree with Plan of Care Patient        Problem List There are no active problems to display for this patient.   Lovvorn, Radene Journey,, SLP 07/16/2014, 1:18 PM  [facility] [address] [place], [place], [address] Phone: [phone]   Fax:  [phone]

## 2014-07-19 ENCOUNTER — Ambulatory Visit: Payer: BLUE CROSS/BLUE SHIELD

## 2014-07-19 DIAGNOSIS — R269 Unspecified abnormalities of gait and mobility: Secondary | ICD-10-CM | POA: Diagnosis not present

## 2014-07-19 DIAGNOSIS — R41841 Cognitive communication deficit: Secondary | ICD-10-CM

## 2014-07-19 NOTE — Therapy (Signed)
[facility] [address] [place], [place], [address] Phone: [phone]   Fax:  [phone]  Speech Language Pathology Treatment  Patient Details  Name: Noelie Renfrow MRN: [id number] Date of Birth: 11-28-1955 Referring Provider:  Wilson Singer, MD  Encounter Date: 07/19/2014      End of Session - 07/19/14 1535    Visit Number 5   Number of Visits 16   Date for SLP Re-Evaluation 08/27/14   SLP Start Time 1448   SLP Stop Time  1534   SLP Time Calculation (min) 46 min   Activity Tolerance Patient tolerated treatment well      Past Medical History  Diagnosis Date  . Stroke   . Hypertension   . Osteoporosis     per patient    Past Surgical History  Procedure Laterality Date  . Cesarean section    . Tonsillectomy    . Hernia repair      There were no vitals taken for this visit.  Visit Diagnosis: Cognitive communication deficit      Subjective Assessment - 07/19/14 1506    Symptoms "I didn't bring my stuff cuz I've been running around all day"             ADULT SLP TREATMENT - 07/19/14 1507    General Information   Behavior/Cognition Alert;Cooperative;Pleasant mood   Treatment Provided   Treatment provided Cognitive-Linquistic   Pain Assessment   Pain Assessment No/denies pain   Cognitive-Linquistic Treatment   Treatment focused on Cognition   Skilled Treatment Pt completed a simple executive function task with 80% success and extra time. PT told SLP it would not have taken her 14 minutes to complete this task prior to CVA. Pt made notes re: her performance when SLP asked pt to re-do task for homework (anticipatory awareness for memory). Pt missed one task, and mis-timed three tasks.   Assessment / Recommendations / Plan   Plan Continue with current plan of care   Progression Toward Goals   Progression toward goals Progressing toward goals            SLP Short Term Goals -  07/19/14 1536    SLP SHORT TERM GOAL #1   Title pt will demo compensations for memory in complex linguistic tasks with rare min cues   Time 2   Period Weeks   Status On-going   SLP SHORT TERM GOAL #2   Title pt will demo compensations for attention in complex linguistic tasks with rare min cues   Time 2   Period Weeks   Status On-going   SLP SHORT TERM GOAL #3   Title pt will achieve 85% success in tasks of simple linguistic executive function   Time 2   Period Weeks   Status Achieved          SLP Long Term Goals - 07/19/14 1537    SLP LONG TERM GOAL #1   Title demo memory compensations in complex linguistic tasks   Time 6   Period Weeks   Status On-going   SLP LONG TERM GOAL #2   Title demo compensations for attention in complex linguistic tasks   Time 6   Period Weeks   Status On-going   SLP LONG TERM GOAL #3   Title demo appropriate skills in executive function for 95% success in linguistic tasks   Time 6   Period Weeks   Status On-going  Plan - 07/19/14 1535    Clinical Impression Statement Skilled ST remains necessary to assist pt in her independence with executive function, attention and memory compensation in order to return to the workforce.   Speech Therapy Frequency 2x / week   Duration --  6 weeks   Treatment/Interventions Environmental controls;Internal/external aids;Compensatory strategies;SLP instruction and feedback;Patient/family education;Functional tasks;Cueing hierarchy   Potential to Achieve Goals Fair   Potential Considerations Severity of impairments        Problem List There are no active problems to display for this patient.   [place],Mykale Gandolfo, SLP 07/19/2014, 3:38 PM  [facility] [address] [place], [place], [address] Phone: [phone]   Fax:  [phone]

## 2014-07-19 NOTE — Patient Instructions (Signed)
  Please complete the assigned speech therapy homework and return it to your next session.  

## 2014-07-23 ENCOUNTER — Ambulatory Visit: Payer: BLUE CROSS/BLUE SHIELD | Attending: Diagnostic Neuroimaging

## 2014-07-23 DIAGNOSIS — R531 Weakness: Secondary | ICD-10-CM | POA: Insufficient documentation

## 2014-07-23 DIAGNOSIS — R279 Unspecified lack of coordination: Secondary | ICD-10-CM | POA: Insufficient documentation

## 2014-07-23 DIAGNOSIS — R269 Unspecified abnormalities of gait and mobility: Secondary | ICD-10-CM | POA: Insufficient documentation

## 2014-07-23 DIAGNOSIS — I6980 Unspecified sequelae of other cerebrovascular disease: Secondary | ICD-10-CM | POA: Diagnosis not present

## 2014-07-23 DIAGNOSIS — R41841 Cognitive communication deficit: Secondary | ICD-10-CM

## 2014-07-23 NOTE — Therapy (Signed)
[facility] [address] [place], [place], [address] Phone: [phone]   Fax:  [phone]  Speech Language Pathology Treatment  Patient Details  Name: Kelsey DrapeKathryn Arvanitis MRN: [id number] Date of Birth: July 29, 1955 Referring Provider:  Wilson SingerGosrani, Nimish C, MD  Encounter Date: 07/23/2014      End of Session - 07/23/14 1458    Visit Number 6   Number of Visits 16   Date for SLP Re-Evaluation 08/27/14   SLP Start Time 1404   SLP Stop Time  1446   SLP Time Calculation (min) 42 min   Activity Tolerance Patient tolerated treatment well      Past Medical History  Diagnosis Date  . Stroke   . Hypertension   . Osteoporosis     per patient    Past Surgical History  Procedure Laterality Date  . Cesarean section    . Tonsillectomy    . Hernia repair      There were no vitals taken for this visit.  Visit Diagnosis: Cognitive communication deficit      Subjective Assessment - 07/23/14 1413    Symptoms Pt brought homework today and presented it immediately to SLP.             ADULT SLP TREATMENT - 07/23/14 1415    General Information   Behavior/Cognition Pleasant mood;Cooperative;Alert   Treatment Provided   Treatment provided Cognitive-Linquistic   Pain Assessment   Pain Assessment 0-10   Pain Score 1    Pain Location h/a   Pain Descriptors / Indicators Dull   Pain Intervention(s) Monitored during session   Cognitive-Linquistic Treatment   Treatment focused on Cognition   Skilled Treatment Pt corrected her simple detailed executive function task from her last visit incorrectly, and req'd SLP cues to notice these errors. (omitted two errands and began one errand at the wrong time). SLP brought up "missing details" to pt as one of her deficit areas. Pt unable to tell SLP what she could do about that once she re-entered workforce without min A from SLP.    Assessment / Recommendations / Plan   Plan Continue  with current plan of care   Progression Toward Goals   Progression toward goals Progressing toward goals          SLP Education - 07/23/14 1457    Education Details vocational rehab   Person(s) Educated Patient   Methods Explanation;Handout   Comprehension Verbalized understanding          SLP Short Term Goals - 07/23/14 1500    SLP SHORT TERM GOAL #1   Title pt will demo compensations for memory in complex linguistic tasks with rare min cues   Time 1   Period Weeks   Status On-going   SLP SHORT TERM GOAL #2   Title pt will demo compensations for attention in complex linguistic tasks with rare min cues   Time 1   Period Weeks   Status On-going   SLP SHORT TERM GOAL #3   Title pt will achieve 85% success in tasks of simple linguistic executive function   Status Achieved          SLP Long Term Goals - 07/23/14 1500    SLP LONG TERM GOAL #1   Title demo memory compensations in complex linguistic tasks   Time 5   Period Weeks   Status On-going   SLP LONG TERM GOAL #2   Title demo compensations for attention in complex linguistic tasks  Time 5   Period Weeks   Status On-going   SLP LONG TERM GOAL #3   Title demo appropriate skills in executive function for 95% success in linguistic tasks   Time 5   Period Weeks   Status On-going          Plan - 07/23/14 1458    Clinical Impression Statement Skilled ST necessary to cont to maximize pt's attention and awareness skills in daily activities to prepare her for possible return to work.   Speech Therapy Frequency 2x / week   Duration --  5 weeks   Treatment/Interventions Environmental controls;Internal/external aids;Compensatory strategies;SLP instruction and feedback;Patient/family education;Functional tasks;Cueing hierarchy   Potential to Achieve Goals Fair   Potential Considerations Severity of impairments        Problem List There are no active problems to display for this patient.   [place],  SLP 07/23/2014, 3:02 PM  [facility] [address] [place], [place], [address] Phone: [phone]   Fax:  [phone]

## 2014-07-23 NOTE — Patient Instructions (Signed)
  Look at info from Vocational Rehab and tell me what you think for the next session.  Continue to use your notebook/planner for help with your memory

## 2014-07-25 ENCOUNTER — Ambulatory Visit: Payer: BLUE CROSS/BLUE SHIELD

## 2014-07-25 DIAGNOSIS — R269 Unspecified abnormalities of gait and mobility: Secondary | ICD-10-CM | POA: Diagnosis not present

## 2014-07-25 DIAGNOSIS — R41841 Cognitive communication deficit: Secondary | ICD-10-CM

## 2014-07-25 NOTE — Therapy (Signed)
[facility] [address] [place], [place], [address] Phone: [phone]   Fax:  [phone]  Speech Language Pathology Treatment  Patient Details  Name: Kelsey Mendez MRN: [id number] Date of Birth: February 21, 1956 Referring Provider:  Wilson SingerGosrani, Nimish C, MD  Encounter Date: 07/25/2014      End of Session - 07/25/14 1643    Visit Number 7   Number of Visits 16   Date for SLP Re-Evaluation 08/27/14   SLP Start Time 1403   SLP Stop Time  1445   SLP Time Calculation (min) 42 min   Activity Tolerance --  Limited by emotional response (crying)      Past Medical History  Diagnosis Date  . Stroke   . Hypertension   . Osteoporosis     per patient    Past Surgical History  Procedure Laterality Date  . Cesarean section    . Tonsillectomy    . Hernia repair      There were no vitals taken for this visit.  Visit Diagnosis: Cognitive communication deficit      Subjective Assessment - 07/25/14 1642    Symptoms "You didn't give me any homework" (true) "This would have been so easy before the stroke."             ADULT SLP TREATMENT - 07/25/14 1427    General Information   Behavior/Cognition Alert;Cooperative;Pleasant mood   Treatment Provided   Treatment provided Cognitive-Linquistic   Pain Assessment   Pain Assessment No/denies pain   Cognitive-Linquistic Treatment   Treatment focused on Cognition   Skilled Treatment Detailed written directions task (men's names, states [place] [address] [place]) with extra time-  pt req'd occasional min-mod A. Pt tearful due to difficulty with task, demonstrating emergent awarenes. SLP attempted to comfort pt to ultimately redirect back to task but largely unsuccessful.   Assessment / Recommendations / Plan   Plan Continue with current plan of care   Progression Toward Goals   Progression toward goals Progressing toward goals            SLP Short Term  Goals - 07/25/14 1646    SLP SHORT TERM GOAL #1   Title pt will demo compensations for memory in complex linguistic tasks with rare min cues   Time 1   Period Weeks   Status On-going   SLP SHORT TERM GOAL #2   Title pt will demo compensations for attention in complex linguistic tasks with rare min cues   Time 1   Period Weeks   Status On-going   SLP SHORT TERM GOAL #3   Title pt will achieve 85% success in tasks of simple linguistic executive function   Status Achieved          SLP Long Term Goals - 07/25/14 1646    SLP LONG TERM GOAL #1   Title demo memory compensations in complex linguistic tasks   Time 5   Period Weeks   Status On-going   SLP LONG TERM GOAL #2   Title demo compensations for attention in complex linguistic tasks   Time 5   Period Weeks   Status On-going   SLP LONG TERM GOAL #3   Title demo appropriate skills in executive function for 95% success in linguistic tasks   Time 5   Period Weeks   Status On-going          Plan - 07/25/14 1645    Clinical Impression Statement Skilled ST necessary  to cont to maximize pt's attention and awareness skills in daily activities to prepare her for possible return to work.   Speech Therapy Frequency 2x / week   Duration --  5 weeks   Treatment/Interventions Environmental controls;Internal/external aids;Compensatory strategies;SLP instruction and feedback;Patient/family education;Functional tasks;Cueing hierarchy   Potential to Achieve Goals Fair   Potential Considerations Severity of impairments        Problem List There are no active problems to display for this patient.   [facility], SLP 07/25/2014, 4:47 PM  [facility] [address] [place], [place], [address] Phone: [phone]   Fax:  [phone]

## 2014-07-25 NOTE — Patient Instructions (Signed)
  Please complete the assigned speech therapy homework and return it to your next session.  

## 2014-07-30 ENCOUNTER — Ambulatory Visit: Payer: BLUE CROSS/BLUE SHIELD

## 2014-07-30 DIAGNOSIS — R41841 Cognitive communication deficit: Secondary | ICD-10-CM

## 2014-07-30 DIAGNOSIS — R269 Unspecified abnormalities of gait and mobility: Secondary | ICD-10-CM | POA: Diagnosis not present

## 2014-07-30 NOTE — Therapy (Signed)
[place] [address] [place], [place], [address] Phone: [phone]   Fax:  [phone]  Speech Language Pathology Treatment  Patient Details  Name: Kelsey Mendez MRN: [id number] Date of Birth: 02/13/1956 Referring Provider:  Doree Albee, MD  Encounter Date: 07/30/2014      End of Session - 07/30/14 1358    Visit Number 8   Number of Visits 16   Date for SLP Re-Evaluation 08/27/14   SLP Start Time 57   SLP Stop Time  1400   SLP Time Calculation (min) 42 min   Activity Tolerance Patient tolerated treatment well      Past Medical History  Diagnosis Date  . Stroke   . Hypertension   . Osteoporosis     per patient    Past Surgical History  Procedure Laterality Date  . Cesarean section    . Tonsillectomy    . Hernia repair      There were no vitals taken for this visit.  Visit Diagnosis: Cognitive communication deficit      Subjective Assessment - 07/30/14 1400    Symptoms "I had homework" "I've been checking a  lot of things twice now at home. I do miss some."             ADULT SLP TREATMENT - 07/30/14 1322    General Information   Behavior/Cognition Alert;Cooperative;Pleasant mood   Treatment Provided   Treatment provided Cognitive-Linquistic   Pain Assessment   Pain Assessment No/denies pain   Cognitive-Linquistic Treatment   Treatment focused on Cognition   Skilled Treatment Pt stated med adminstration is WNL, and bought an accordian folder for bills and that is "working great - it was a great idea." Goal met for memory (STG#1). Pt worked with detailed written tasks and performed auditory task with alternating attention only - pt did not exhibit divided attention despite being told to. Auditory tasks 95%, written task 90%.   Assessment / Recommendations / Plan   Plan Continue with current plan of care   Progression Toward Goals   Progression toward goals Progressing toward  goals            SLP Short Term Goals - 07/30/14 1338    SLP SHORT TERM GOAL #1   Title pt will demo compensations for memory in complex linguistic tasks with rare min cues   Status Achieved   SLP SHORT TERM GOAL #2   Title pt will demo compensations for attention in complex linguistic tasks with rare min cues   Time --   Period --   Status Achieved   SLP SHORT TERM GOAL #3   Title pt will achieve 85% success in tasks of simple linguistic executive function   Status Achieved          SLP Long Term Goals - 07/30/14 1337    SLP LONG TERM GOAL #1   Title demo memory compensations in complex linguistic tasks   Time 4   Period Weeks   Status On-going   SLP LONG TERM GOAL #2   Title demo compensations for attention in complex linguistic tasks   Time 4   Period Weeks   Status On-going   SLP LONG TERM GOAL #3   Title demo appropriate skills in executive function for 95% success in linguistic tasks   Time 4   Period Weeks   Status On-going   SLP LONG TERM GOAL #4   Title Demo 90% success in functional written  detailed tasks   Time 4   Period Weeks   Status New          Plan - 07/30/14 1358    Clinical Impression Statement Skilled ST necessary to cont to maximize pt's attention and awareness skills in daily activities to prepare her for possible return to work.   Speech Therapy Frequency 2x / week   Duration 4 weeks   Treatment/Interventions Environmental controls;Internal/external aids;Compensatory strategies;SLP instruction and feedback;Patient/family education;Functional tasks;Cueing hierarchy   Potential to Achieve Goals Fair   Potential Considerations Severity of impairments   Consulted and Agree with Plan of Care Patient        Problem List There are no active problems to display for this patient.   [place], [place] 07/30/2014, 2:01 PM  [place] [address] [place], [place],  [address] Phone: [phone]   Fax:  [phone]

## 2014-07-30 NOTE — Patient Instructions (Signed)
  Please complete the assigned speech therapy homework and return it to your next session.  

## 2014-08-02 ENCOUNTER — Ambulatory Visit: Payer: BLUE CROSS/BLUE SHIELD

## 2014-08-02 DIAGNOSIS — R41841 Cognitive communication deficit: Secondary | ICD-10-CM

## 2014-08-02 DIAGNOSIS — R269 Unspecified abnormalities of gait and mobility: Secondary | ICD-10-CM | POA: Diagnosis not present

## 2014-08-02 NOTE — Therapy (Signed)
[facility] [address] [place], [place], [address] Phone: [phone]   Fax:  [phone]  Speech Language Pathology Treatment  Patient Details  Name: Kelsey DrapeKathryn Mendez MRN: [id number] Date of Birth: 06/03/1955 Referring Provider:  Wilson SingerGosrani, Nimish C, MD  Encounter Date: 08/02/2014      End of Session - 08/02/14 1446    Visit Number 9   Number of Visits 16   Date for SLP Re-Evaluation 08/27/14   SLP Start Time 1404   SLP Stop Time  1446   SLP Time Calculation (min) 42 min   Activity Tolerance Patient tolerated treatment well      Past Medical History  Diagnosis Date  . Stroke   . Hypertension   . Osteoporosis     per patient    Past Surgical History  Procedure Laterality Date  . Cesarean section    . Tonsillectomy    . Hernia repair      There were no vitals filed for this visit.  Visit Diagnosis: Cognitive communication deficit      Subjective Assessment - 08/02/14 1419    Symptoms Pt got homework for SLP upon entry to ST room. "It's been a crazy week, Baldo AshCarl!"               ADULT SLP TREATMENT - 08/02/14 1417    General Information   Behavior/Cognition Alert;Cooperative;Pleasant mood   Treatment Provided   Treatment provided Cognitive-Linquistic   Pain Assessment   Pain Assessment No/denies pain   Cognitive-Linquistic Treatment   Treatment focused on Cognition   Skilled Treatment Pt states medications are still going well despite the busy-ness of pt's family this week. SLP assisted pt to write/recall for later items to tell pt's father's PCP why he needs a CNA. Pt did not spontaneously write these items down. Pt worked simple written task and attempted auditory comprehension simultaneously - pt adhered to alternating instead of divided attention 100%.    Assessment / Recommendations / Plan   Plan Continue with current plan of care   Progression Toward Goals   Progression toward goals  Progressing toward goals            SLP Short Term Goals - 07/30/14 1338    SLP SHORT TERM GOAL #1   Title pt will demo compensations for memory in complex linguistic tasks with rare min cues   Status Achieved   SLP SHORT TERM GOAL #2   Title pt will demo compensations for attention in complex linguistic tasks with rare min cues   Time --   Period --   Status Achieved   SLP SHORT TERM GOAL #3   Title pt will achieve 85% success in tasks of simple linguistic executive function   Status Achieved          SLP Long Term Goals - 08/02/14 1446    SLP LONG TERM GOAL #1   Title demo memory compensations in complex linguistic tasks   Time 4   Period Weeks   Status On-going   SLP LONG TERM GOAL #2   Title demo compensations for attention in complex linguistic tasks   Time 4   Period Weeks   Status On-going   SLP LONG TERM GOAL #3   Title demo appropriate skills in executive function for 95% success in linguistic tasks   Time 4   Period Weeks   Status On-going   SLP LONG TERM GOAL #4   Title Demo 90% success in functional  written detailed tasks   Time 4   Period Weeks   Status On-going          Plan - 08/02/14 1446    Clinical Impression Statement Skilled ST necessary to cont to maximize pt's attention and awareness skills in daily activities to prepare her for possible return to work.   Speech Therapy Frequency 2x / week   Duration 4 weeks   Treatment/Interventions Environmental controls;Internal/external aids;Compensatory strategies;SLP instruction and feedback;Patient/family education;Functional tasks;Cueing hierarchy   Potential to Achieve Goals Fair   Potential Considerations Severity of impairments        Problem List There are no active problems to display for this patient.   [place], SLP 08/02/2014, 2:47 PM  [facility] [address] [place], [place], [address] Phone: [phone]    Fax:  [phone]

## 2014-08-02 NOTE — Patient Instructions (Signed)
Continue to work with doing two things at once at home.  ============================================== Divided Attention ideas - any can be done together  Playing a card game either by yourself or with someone  Playing a board game with someone  Sorting playing cards  Writing down commercials on TV or the radio  Writing down news stories on TV or radio  Working outside with yard work, gardening, Catering manageretc.  [employer]ashing dishes  Recite state names and capitals  Complete long division or 2 or 3 digit multiplication problems  Listen to a Therapist, sportsspeech online (i.e., YouTube), to online news (NPR, CNN, etc.), or to a family member, and give pertinent information from the speech, news story, or conversation after it is over  Empty the dishwasher  Complete paper/pencil puzzles (sudoku, crosswords, word search)  Complete puzzles online (www.lumosity.com -computer/tablet based, or www.constanttherapy.com - iPad based)  Therapy worksheets/exercises  Put together a puzzle  State coins needed to make a certain change amount  Have someone read a portion of a novel to you, and you give a summary    Start at a level that you are challenged by, but also where you can see some success

## 2014-08-14 ENCOUNTER — Ambulatory Visit: Payer: BLUE CROSS/BLUE SHIELD

## 2014-08-14 DIAGNOSIS — R269 Unspecified abnormalities of gait and mobility: Secondary | ICD-10-CM | POA: Diagnosis not present

## 2014-08-14 DIAGNOSIS — R41841 Cognitive communication deficit: Secondary | ICD-10-CM

## 2014-08-14 NOTE — Patient Instructions (Signed)
  Please complete the assigned speech therapy homework and return it to your next session.  

## 2014-08-14 NOTE — Therapy (Signed)
[facility] [address] [place], [place], [address] Phone: [phone]   Fax:  [phone]  Speech Language Pathology Treatment  Patient Details  Name: Kelsey Mendez MRN: [id number] Date of Birth: 1955/10/21 Referring Provider:  Wilson SingerGosrani, Nimish C, MD  Encounter Date: 08/14/2014      End of Session - 08/14/14 0938    SLP Start Time 0851   SLP Stop Time  0934   SLP Time Calculation (min) 43 min   Activity Tolerance Patient tolerated treatment well      Past Medical History  Diagnosis Date  . Stroke   . Hypertension   . Osteoporosis     per patient    Past Surgical History  Procedure Laterality Date  . Cesarean section    . Tonsillectomy    . Hernia repair      There were no vitals filed for this visit.  Visit Diagnosis: Cognitive communication deficit      Subjective Assessment - 08/14/14 0914    Symptoms Pt's meds cont to go well with medication box.               ADULT SLP TREATMENT - 08/14/14 0856    General Information   Behavior/Cognition Alert;Cooperative;Pleasant mood   Treatment Provided   Treatment provided Cognitive-Linquistic   Pain Assessment   Pain Assessment 0-10   Pain Score 2    Pain Location h/a   Pain Descriptors / Indicators Nagging   Pain Intervention(s) Monitored during session   Cognitive-Linquistic Treatment   Treatment focused on Cognition   Skilled Treatment SLP engaged pt in lengthy discussion re: vocational rehab services and options they provide. Pt demonstrated good demonstration of memory compensationsations during this discussion, however when SLP asked her questions while she wrote down cues for remembering tasks for later, pt 0% success with multitasking (SLP used skilled observation to ID alternating attention).    Assessment / Recommendations / Plan   Plan Continue with current plan of care   Progression Toward Goals   Progression toward goals  Progressing toward goals          SLP Education - 08/14/14 0914    Education provided Yes   Education Details vocational rehab   Person(s) Educated Patient   Methods Explanation   Comprehension Verbalized understanding          SLP Short Term Goals - 07/30/14 1338    SLP SHORT TERM GOAL #1   Title pt will demo compensations for memory in complex linguistic tasks with rare min cues   Status Achieved   SLP SHORT TERM GOAL #2   Title pt will demo compensations for attention in complex linguistic tasks with rare min cues   Time --   Period --   Status Achieved   SLP SHORT TERM GOAL #3   Title pt will achieve 85% success in tasks of simple linguistic executive function   Status Achieved          SLP Long Term Goals - 08/14/14 0904    SLP LONG TERM GOAL #1   Title demo memory compensations in complex linguistic tasks   Time --   Period --   Status Achieved   SLP LONG TERM GOAL #2   Title demo compensations for attention in complex linguistic tasks   Time 3   Period Weeks   Status On-going   SLP LONG TERM GOAL #3   Title demo appropriate skills in executive function for 95% success in  linguistic tasks   Time 3   Period Weeks   Status On-going   SLP LONG TERM GOAL #4   Title Demo 90% success in functional written detailed tasks   Time 3   Period Weeks   Status On-going          Plan - 08/14/14 0932    Clinical Impression Statement Skilled ST necessary to cont to maximize pt's attention and awareness skills in daily activities to prepare her for possible return to work.   Speech Therapy Frequency 2x / week   Duration --  3 weeks   Treatment/Interventions Environmental controls;Internal/external aids;Compensatory strategies;SLP instruction and feedback;Patient/family education;Functional tasks;Cueing hierarchy   Potential to Achieve Goals Fair   Potential Considerations Severity of impairments        Problem List There are no active problems to display  for this patient.   [facility], SLP 08/14/2014, 9:38 AM  [facility] [address] [place], [place], [address] Phone: [phone]   Fax:  [phone]

## 2014-08-15 ENCOUNTER — Ambulatory Visit: Payer: BLUE CROSS/BLUE SHIELD | Admitting: Speech Pathology

## 2014-08-15 DIAGNOSIS — R41841 Cognitive communication deficit: Secondary | ICD-10-CM

## 2014-08-15 NOTE — Therapy (Signed)
[facility] [address] [place], [place], [address] Phone: [phone]   Fax:  [phone]  Speech Language Pathology Treatment  Patient Details  Name: Barrie Sigmund MRN: [id number] Date of Birth: [birth year] Referring Provider:  Wilson Singer, MD  Encounter Date: 08/15/2014      End of Session - 08/15/14 1107    SLP Stop Time  1106      Past Medical History  Diagnosis Date  . Stroke   . Hypertension   . Osteoporosis     per patient    Past Surgical History  Procedure Laterality Date  . Cesarean section    . Tonsillectomy    . Hernia repair      There were no vitals filed for this visit.  Visit Diagnosis: Cognitive communication deficit      Subjective Assessment - 08/15/14 1028    Symptoms Pt 10 min late to session               ADULT SLP TREATMENT - 08/15/14 1029    General Information   Behavior/Cognition Alert;Cooperative;Pleasant mood   Treatment Provided   Treatment provided Cognitive-Linquistic   Pain Assessment   Pain Assessment No/denies pain   Cognitive-Linquistic Treatment   Treatment focused on Cognition   Skilled Treatment Reviwed Homework with pt - she missed 3 news subjects in 45 minutes. Pt verbalized anticipatory awareness that she is not able to return to her former job b/c she is not accurate with details and is much slower with processing information now.  Pt  performed functional writing tasks  - various types letters - alternating attention between writing task and naming 5-6 letter word spelled by ST.  Pt paused writing to ID word then returned to her sentence/thought with rare min A and mild pausing.  Pt  generated list of steps required to complete given tasks using executive function with min questioning cues.    Assessment / Recommendations / Plan   Plan Continue with current plan of care   Progression Toward Goals   Progression toward goals Progressing  toward goals          SLP Education - 08/15/14 1101    Education provided Yes   Education Details reviwed vocation rehab   Person(s) Educated Patient   Methods Explanation   Comprehension Verbalized understanding          SLP Short Term Goals - 08/15/14 1102    SLP SHORT TERM GOAL #1   Title pt will demo compensations for memory in complex linguistic tasks with rare min cues   Time 1   Period Weeks   Status On-going   SLP SHORT TERM GOAL #2   Title pt will demo compensations for attention in complex linguistic tasks with rare min cues   Time 1   Period Weeks   Status On-going   SLP SHORT TERM GOAL #3   Title pt will achieve 85% success in tasks of simple linguistic executive function   Status Achieved          SLP Long Term Goals - 08/15/14 1105    SLP LONG TERM GOAL #1   Title demo memory compensations in complex linguistic tasks   Status Achieved   SLP LONG TERM GOAL #2   Title demo compensations for attention in complex linguistic tasks   Time 3   Period Weeks   Status On-going   SLP LONG TERM GOAL #3   Title demo appropriate skills in  executive function for 95% success in linguistic tasks   Time 3   Period Weeks   Status On-going   SLP LONG TERM GOAL #4   Title Demo 90% success in functional written detailed tasks   Time 3   Period Weeks   Status Achieved          Plan - 08/15/14 1214    Clinical Impression Statement Skilled ST necessary to cont to maximize pt's attention and awareness skills in daily activities to prepare her for possible return to work.        Problem List There are no active problems to display for this patient.   Ramses Klecka, Radene JourneyLaura Ann , SLP  08/15/2014, 12:14 PM  [place] [facility] [address] [place], [place], [address] Phone: [phone]   Fax:  [phone]

## 2014-08-15 NOTE — Patient Instructions (Signed)
Pt to complete 5 planning/sequening executive function tasks while watching TV and writing down commercials

## 2014-08-20 ENCOUNTER — Ambulatory Visit: Payer: BLUE CROSS/BLUE SHIELD | Admitting: Speech Pathology

## 2014-08-23 ENCOUNTER — Ambulatory Visit: Payer: BLUE CROSS/BLUE SHIELD | Attending: Diagnostic Neuroimaging

## 2014-08-23 DIAGNOSIS — R531 Weakness: Secondary | ICD-10-CM | POA: Diagnosis not present

## 2014-08-23 DIAGNOSIS — R269 Unspecified abnormalities of gait and mobility: Secondary | ICD-10-CM | POA: Insufficient documentation

## 2014-08-23 DIAGNOSIS — R41841 Cognitive communication deficit: Secondary | ICD-10-CM

## 2014-08-23 DIAGNOSIS — I6980 Unspecified sequelae of other cerebrovascular disease: Secondary | ICD-10-CM | POA: Diagnosis not present

## 2014-08-23 DIAGNOSIS — R279 Unspecified lack of coordination: Secondary | ICD-10-CM | POA: Diagnosis not present

## 2014-08-23 NOTE — Patient Instructions (Signed)
I will talk with Dr. Marjory LiesPenumalli about what he thinks about a follow up neuropsychological evaluation since it has been over a year since your last one. We will decide whether or not to continue ST after seeing the results of that evaluation. This evaluation may also help you to decide whether or not you want to return to work.

## 2014-08-23 NOTE — Therapy (Signed)
[facility] [address] [place], [place], [address] Phone: [phone]   Fax:  [phone]  Speech Language Pathology Treatment  Patient Details  Name: Kelsey Mendez MRN: [id number] Date of Birth: 08/07/1955 Referring Provider:  Gosrani, Nimish C, MD  Encounter Date: 08/23/2014      End of Session - 08/23/14 1349    Visit Number 12   Number of Visits 16   Date for SLP Re-Evaluation 08/27/14   SLP Start Time 1106   SLP Stop Time  1152   SLP Time Calculation (min) 46 min      Past Medical History  Diagnosis Date  . Stroke   . Hypertension   . Osteoporosis     per patient    Past Surgical History  Procedure Laterality Date  . Cesarean section    . Tonsillectomy    . Hernia repair      There were no vitals filed for this visit.  Visit Diagnosis: Cognitive communication deficit      Subjective Assessment - 08/23/14 1113    Symptoms Pt 5 minutes late. "It's been a bad week with parents." (father's health)               ADULT SLP TREATMENT - 08/23/14 1114    General Information   Behavior/Cognition Alert;Cooperative;Pleasant mood   Treatment Provided   Treatment provided Cognitive-Linquistic   Pain Assessment   Pain Assessment No/denies pain   Cognitive-Linquistic Treatment   Treatment focused on Cognition   Skilled Treatment SLP assessed pt's ability with compensating for attention and memory. According to situations described this week, pt doing adequate job in the situation with memory and attention compensations.           SLP Education - 08/23/14 1348    Education provided Yes   Education Details planner for memory compensation needs to be ONE planner, not multiple notebooks/calendars   Person(s) Educated Patient   Methods Explanation;Demonstration   Comprehension Verbalized understanding          SLP Short Term Goals - 08/23/14 1130    SLP SHORT TERM GOAL #1   Title pt  will demo compensations for memory in complex linguistic tasks with rare min cues   Time 1   Period Weeks   Status Not Met   SLP SHORT TERM GOAL #2   Title pt will demo compensations for attention in complex linguistic tasks with rare min cues   Time 1   Period Weeks   Status Not Met   SLP SHORT TERM GOAL #3   Title pt will achieve 85% success in tasks of simple linguistic executive function   Status Achieved          SLP Long Term Goals - 08/23/14 1131    SLP LONG TERM GOAL #1   Title demo memory compensations in complex linguistic tasks   Status Achieved   SLP LONG TERM GOAL #2   Title demo compensations for attention in complex linguistic tasks   Time 3   Period Weeks   Status Achieved   SLP LONG TERM GOAL #3   Title demo appropriate skills in executive function for 95% success in linguistic tasks   Time 3   Period Weeks   Status Not Met   SLP LONG TERM GOAL #4   Title Demo 90% success in functional written detailed tasks   Time 3   Period Weeks   Status Achieved            Plan - 08/23/14 1350    Clinical Impression Statement Pt and SLP discussed whether or  not to discharge and ultimately decided d/c would be best at this time with pt returning at later date if decided that more ST would be beneficial, following neuropsych eval.   Treatment/Interventions Environmental controls;Internal/external aids;Compensatory strategies;SLP instruction and feedback;Patient/family education;Functional tasks;Cueing hierarchy   Potential to Achieve Goals Fair   Potential Considerations Severity of impairments     SPEECH THERAPY DISCHARGE SUMMARY  Visits from Start of Care: 12  Current functional level related to goals / functional outcomes: Pt met 1/3 STGs in the first four weeks (as above, under "short term goals"). She met 3/4 long term goals - goal for executive function was not met. She reported demonstrating compensations for attention in situations outside ST sessions.  Her compensation for memory was primarily to write information down however pt wrote information in 3 different locations. SLP encouraged pt to have ONE place to write information to be better organized.   Remaining deficits: High level cognitive deficits including divided attention and more complex tasks involving executive function. "I can't make decisions as easy as I used to."   Education / Equipment: Stage manager, compensations for attention and memory  Plan: Patient agrees to discharge.  Patient goals were partially met. Patient is being discharged due to                                                     ?????possibly obtaining further therapy following possible neuropsych eval.        Problem List There are no active problems to display for this patient.   [place] , [place]  08/23/2014, 1:52 PM  [place] [address] [place], [place], [address] Phone: [phone]   Fax:  [phone]

## 2014-08-28 ENCOUNTER — Ambulatory Visit: Payer: BLUE CROSS/BLUE SHIELD

## 2014-10-09 ENCOUNTER — Ambulatory Visit (INDEPENDENT_AMBULATORY_CARE_PROVIDER_SITE_OTHER): Payer: BLUE CROSS/BLUE SHIELD | Admitting: Diagnostic Neuroimaging

## 2014-10-09 ENCOUNTER — Encounter: Payer: Self-pay | Admitting: Diagnostic Neuroimaging

## 2014-10-09 VITALS — BP 156/95 | HR 74 | Ht 66.0 in | Wt 312.3 lb

## 2014-10-09 DIAGNOSIS — G43809 Other migraine, not intractable, without status migrainosus: Secondary | ICD-10-CM

## 2014-10-09 DIAGNOSIS — I639 Cerebral infarction, unspecified: Secondary | ICD-10-CM | POA: Diagnosis not present

## 2014-10-09 NOTE — Progress Notes (Signed)
[facility]  PATIENT: Kelsey DrapeKathryn Altidor DOB: 04-Oct-1955  REFERRING CLINICIAN: Karilyn CotaGosrani HISTORY FROM: patient  REASON FOR VISIT: follow up   HISTORICAL  CHIEF COMPLAINT:  Chief Complaint  Patient presents with  . Follow-up    stroke    HISTORY OF PRESENT ILLNESS:   UPDATE 10/09/14: Since last visit, patient is stable. Has tried PT, ST. Still with right sided weakness and pain. Some headaches. Some stress related to her parents (father stage 4 cancer; mother leaky heart valve).   PRIOR HPI (04/10/14): 59 year old female with hypertension, here for evaluation of post stroke memory loss and right leg pain. 01/16/2013 [place], at [place], had headache and took an aspirin. She is having difficulty fixing her computer. IT help came in was easily able to fix the computer. Patient drove home. Patient noticed her words were not coming out correctly. Next day patient had elevated blood pressure. The next day patient was having more slurred speech and went to the hospital. She had right arm and right leg weakness as well. Patient was diagnosed with a stroke, evaluated and then discharged with speech therapy, physical neck basal therapy. [facility] [place] [facility] [place]. Patient still has ongoing cognitive problems, memory difficulty, right leg pain. Patient also having intermittent headaches in the evening. Sometimes she has photophobia and throbbing sensation. Patient was prescribed Topamax which has helped a little bit.   REVIEW OF SYSTEMS: Full 14 system review of systems performed and notable only for fatigue chest pain cold intolerance constipation joint pain aching muscles cramps agitation depression anxiety headaches weakness tremors.   ALLERGIES: No Known Allergies  HOME MEDICATIONS: Outpatient Prescriptions Prior to Visit  Medication Sig Dispense Refill  . aspirin ([facility] ASPIRIN) 325 MG tablet Take 325 mg by mouth daily.    Marland Kitchen.  atorvastatin (LIPITOR) 40 MG tablet Take 40 mg by mouth daily.    Marland Kitchen. escitalopram (LEXAPRO) 10 MG tablet Take 10 mg by mouth daily.    . metoprolol tartrate (LOPRESSOR) 25 MG tablet Take 25 mg by mouth 2 (two) times daily.    Marland Kitchen. topiramate (TOPAMAX) 50 MG tablet Take 50 mg by mouth at bedtime.     No facility-administered medications prior to visit.    PAST MEDICAL HISTORY: Past Medical History  Diagnosis Date  . Stroke   . Hypertension   . Osteoporosis     per patient    PAST SURGICAL HISTORY: Past Surgical History  Procedure Laterality Date  . Cesarean section    . Tonsillectomy    . Hernia repair      FAMILY HISTORY: Family History  Problem Relation Age of Onset  . Hypertension Mother   . Diabetes Mother   . Cancer Father   . Hypertension Father   . Glaucoma Father   . Congestive Heart Failure Sister   . Sarcoidosis Sister   . Cancer Father     stage 4     SOCIAL HISTORY:  History   Social History  . Marital Status: Single    Spouse Name: N/A  . Number of Children: 1  . Years of Education: BS   Occupational History  .  Other    disabled   Social History Main Topics  . Smoking status: Former Smoker -- 0.25 packs/day for 9 years    Types: Cigarettes    Quit date: 01/16/2013  . Smokeless tobacco: Not on file  . Alcohol Use: No  . Drug Use: No  .  Sexual Activity: Not on file   Other Topics Concern  . Not on file   Social History Narrative   Patient lives at home alone.   Caffeine Use: 2-3 cups daily     PHYSICAL EXAM  Filed Vitals:   10/09/14 1428  BP: 156/95  Pulse: 74  Height: 5\' 6"  (1.676 m)  Weight: 312 lb 4.8 oz (141.658 kg)   Body mass index is 50.43 kg/(m^2).  No exam data present  No flowsheet data found.    GENERAL EXAM: Patient is in no distress; well developed, nourished and groomed; neck is supple  CARDIOVASCULAR: Regular rate and rhythm, no murmurs, no carotid bruits  NEUROLOGIC: MENTAL STATUS: awake, alert,  language fluent, comprehension intact, naming intact, fund of knowledge appropriate; SLIGHTLY SLOWED SPEECH PATTERN. CRANIAL NERVE: no papilledema on fundoscopic exam, pupils equal and reactive to light, visual fields full to confrontation, extraocular muscles intact, no nystagmus, facial sensation symmetric, DECR RIGHT NL FOLD AND RIGHT LOWER FACIAL STRENGTH; hearing intact, palate elevates symmetrically, uvula midline, shoulder shrug symmetric, tongue midline; MINIMAL DYSARTHRIA. MOTOR: normal bulk and tone, full strength in the LUE, LLE; RUE 4+. RLE 4 PROX, 5 DISTAL SENSORY: normal and symmetric to light touch COORDINATION: finger-nose-finger, fine finger movements normal REFLEXES: deep tendon reflexes present and symmetric GAIT/STATION: narrow based gait; romberg is negative     DIAGNOSTIC DATA (LABS, IMAGING, TESTING) - I reviewed patient records, labs, notes, testing and imaging myself where available.  No results found for: WBC, HGB, HCT, MCV, PLT No results found for: NA, K, CL, CO2, GLUCOSE, BUN, CREATININE, CALCIUM, PROT, ALBUMIN, AST, ALT, ALKPHOS, BILITOT, GFRNONAA, GFRAA No results found for: CHOL, HDL, LDLCALC, LDLDIRECT, TRIG, CHOLHDL No results found for: [id number] No results found for: VITAMINB12 No results found for: TSH    ASSESSMENT AND PLAN  59 y.o. year old female here with history of stroke in 2014, likely left MCA distribution. Now with post stroke pain in right leg and mild cognitive difficulties. Also with migraine type headaches.  PLAN: - continue aspirin, statin, BP control for secondary stroke prevention - continue TPX 50mg  BID for migraine - good nutrition and exercise strategies reviewed  Return if symptoms worsen or fail to improve, for return to PCP.    Suanne MarkerVIKRAM R. Oriyah Lamphear, MD 10/09/2014, 2:46 PM Certified in Neurology, Neurophysiology and Neuroimaging  [facility] [address], [address] [place], [place] [id number] [phone]  [phone]

## 2014-11-12 ENCOUNTER — Ambulatory Visit: Payer: BLUE CROSS/BLUE SHIELD | Attending: Family Medicine | Admitting: Psychology

## 2014-11-12 DIAGNOSIS — I6991 Cognitive deficits following unspecified cerebrovascular disease: Secondary | ICD-10-CM | POA: Insufficient documentation

## 2014-11-12 DIAGNOSIS — I69319 Unspecified symptoms and signs involving cognitive functions following cerebral infarction: Secondary | ICD-10-CM

## 2014-11-13 NOTE — Progress Notes (Signed)
[facility]  [address]   Telephone [phone] [address] Fax [phone] [place], [place] [address]  Initial Contact Note  Name:  Sana Keesee Date of Birth; 1955-07-07 MRN:  [id number] Date:  11/13/2014  Angeleen Putman is an 59 y.o. female who was referred for neuropsychological evaluation by Joycelyn Schmid, MD to evaluate her cognitive functioning approximately two years after she suffered a stroke.    A total of 5 hours was spent today reviewing medical records, interviewing (CPT [phone]) Kalman Drape and administering and scoring neurocognitive tests (CPT [phone] & [phone]).  Diagnostic Impression: Residual Cognitive Deficit as a late effect of stroke [I69.91]  There were no concerns expressed or behaviors displayed by Kalman Drape that would require immediate attention.   A full report will follow once the planned testing has been completed. Her next appointment is scheduled for 11/14/14.   Gladstone Pih, Ph.D Licensed Psychologist 11/13/2014

## 2014-11-14 ENCOUNTER — Ambulatory Visit (INDEPENDENT_AMBULATORY_CARE_PROVIDER_SITE_OTHER): Payer: BLUE CROSS/BLUE SHIELD | Admitting: Psychology

## 2014-11-14 DIAGNOSIS — I69319 Unspecified symptoms and signs involving cognitive functions following cerebral infarction: Secondary | ICD-10-CM

## 2014-11-14 DIAGNOSIS — I6991 Cognitive deficits following unspecified cerebrovascular disease: Secondary | ICD-10-CM

## 2014-11-14 NOTE — Progress Notes (Addendum)
[facility]  [address]   Telephone [phone] [address] Fax [phone] [place], [place] [address]   NEUROPSYCHOLOGICAL EVALUATION *CONFIDENTIAL* This report should not be released without the consent of the client  Name:   Kelsey Mendez Date of Birth:  12/22/2055 [facility] MR#:  [id number] Dates of Evaluation: 11/12/14 & 11/14/14  Reason for Referral Kelsey Mendez is a 59 year old right-handed woman who suffered a stroke of likely left middle cerebral artery distribution on 01/16/13. Her speech-language pathologist at the [facility], Mr. Verdie Mosher, requested neuropsychological evaluation to assist with rehabilitation treatment planning. Medical referral was obtained from Joycelyn Schmid, MD of [facility].  Sources of Information Electronic medical records from the [facility] as well as a neuropsychological evaluation report from January 2015 were reviewed. Kelsey Mendez was interviewed.   History of Illness, Chief Complaints & Background Kelsey Mendez suffered her stroke while living with her sister in [place]. Prior to her stroke, she had been living independently and was employed fulltime.  A report of a neuropsychological evaluation completed in January 2015 was reviewed. It was noted that Kelsey Mendez complained of persistent headaches, right leg pain and cognitive difficulties. She was described as managing her instrumental activities of daily living independently except for some difficulty paying her bills on time. Neuropsychological evaluation indicated "notable impairments in attention and concentration, memory, language fluency, motor functioning and executive functioning." Her emotional functioning was considered to reflect an Adjustment Disorder with Depressed Mood. Individual psychotherapy was recommended. In addition, the authors noted that Kelsey Mendez had received "a very minimal amount of  rehabilitative services" and therefore recommended that she receive further speech-language therapy and occupational therapy. It was concluded that she was unable to return to work on the basis of her cognitive deficits.   She was deemed disabled from employment in March 2015 due to her persisting stroke-related deficits.  She moved to [place] in July 2015, in part to be closer to family, especially as her father had been diagnosed with cancer.   She began participating in Speech-Language Pathology treatment services at the [facility] in early February 2016 to address her persisting cognitive difficulties. It was noted that a primary goal of treatment was to teach her to use compensations for her deficits in attention and memory. Upon discharge on 08/23/14, it was noted that her memory was improved via use of memory compensatory strategies though she demonstrated high level cognitive deficits for divided attention and on complex tasks involving executive function.   Currently, Kelsey Mendez cited her main cognitive problems as intermittent difficulties finding words to express herself, trouble following conversations between more than one person at a time, difficulty formulating ideas and trouble making decisions. She added that sometimes her mind "goes blank" for about five or ten minutes. She stated her belief that her everyday memory functioning has improved with the use of compensatory strategies, such as taking notes and using a calendar.   In addition to cognitive difficulties, she reported that since her stroke she has experienced mild right-sided weakness, right leg pain, decreased balance (without recent falls), mild bitemporal headaches (occurring several times per week, especially when she feels fatigued or stressed) and sleep onset insomnia (averages about five hours sleep per night). She denied problems with vision, hearing, daytime alertness, speech, swallowing, taste  or appetite.   With regards to her emotional functioning, she described herself as typically in good spirits but has lately been worried about  her father, who has cancer. She also reported feeling "frustrated" when two people talk at the same time. She reported that her sister has told her that she has been "moody", marked primarily by flashes of irritability. She denied persisting sad mood, apathy, labile affect, mood instability, loss of interests or pleasure, undue anxiety, social withdrawal, suicidal or homicidal thoughts, hallucinations or delusions. She reported that she has been taking escitalopram on an as needed basis about once per month for transient sad mood.  She reported that she has been living independently since a few months after her stroke. She reported no difficulties with cooking, driving, medication administration or bill paying. She has considered the possibility of going back to work at some point though expressed concern that her headaches and cognitive problems would interfere. In addition, she is uncertain as what she would be able to do.   Her past medical history in addition to stroke was notable for hyperlipidemia, hypertension and morbid obesity. She reported no history of head injury, seizures, neurological infections, exposure to neurotoxic chemicals or substance abuse. She reported that she smoked cigarettes for about ten years but quit when she had her stroke.  Her current medications include aspirin, atorvastatin, escitalopram, metoprolol and topiramate.    Kelsey Mendez has never been married. She has a twenty 88 year old son. She was born and raised in [place]. She had moved to [facility] in 2012 to live with her sister and brother-in-law.  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 She reported no history of negative job feedback or termination for cause.  She reported that she earned a Technical brewer in Education from [employer]. She denied history of school-based attentional or learning problems.  She reported no history of emotional difficulties or mental health contacts prior to her stroke. She was started on escitalopram after her stroke. She did not receive any psychological treatment services after her stroke. She denied history of depression, suicidal behavior, mood instability or psychotic symptoms.  She reported no past of current legal issues.  Evaluation Procedures In addition to a review of medical records and clinical interview, the following tests and questionnaires were administered: Scientist, research (life sciences)  [facility] Naming Test Controlled Oral Word Association Test Finger Tapping Test Grooved Pegboard Test Hand Dynamometer Patient Health Questionnaire  Rey Complex Figure: Copy Rey 15-Item Memory Test Trail Making A & B Wechsler Adult Intelligence Scale-IV: Clinical cytogeneticist, Coding, Digit Span, Matrix Reasoning & Similarities   Wechsler Memory Scale-IV [employer] Test Wide Range Achievement Test-4: Word Reading  Test Results Observations, Test Validity & Interpretive Considerations She appeared as an appropriately dressed and groomed obese woman in no apparent distress. She interacted in a consistently pleasant, animated and cooperative manner. She spoke in a normal tone of voice, maintained good eye contact and responded to all questions. She was talkative and seemed to be in good spirits. Her affect appeared within a wide range and was congruent with her thoughts. Her thought processes were coherent and organized without loose associations, verbal perseverations or flight of ideas. Her thought content was devoid of unusual or bizarre ideas.  It was concluded that the test results represented a valid measure of her cognitive functioning. She did not report or display problems with vision (she wore her eyeglasses), hearing or motor  skills. She did not display signs of physical or emotional distress. She was able to  comprehend task instructions without difficulty. Her response speed tended to be slow. There were no signs of careless or perseverative responding. She appeared to expend maximal effort. The adequacy of her test-taking effort was confirmed on a validity test (Rey 15-Item Memory Test) that required the immediate drawing of fifteen symbols from memory. Although it is presented as a difficult task, the redundancy amongst the items reduces the amount of information to be remembered thereby making this a relatively easy task. She reproduced all fifteen symbols.  Her pre-morbid intellectual level was estimated to fall within the Average range based on demographic factors coupled with a measure of word reading (Wide Range Achievement Test-4), which represents an over-learned verbal skill that is relatively resistant to the effects of neurological disorder or injury.   Her test scores were corrected to reflect norms for her age and whenever possible, her gender, race (i.e., African-American) and educational level (i.e., 16 years). A listing of test results can be found at the end of this report.  Attention & Speed of Processing Her speed to complete tests that required the processing of simple or routine visual information fell within the impaired range. These tests required transcription of symbols to match digits using a key (WAIS-IV Coding) or drawing lines to connect randomly arrayed numbers in sequence (Trails A). Her attentional capacity to encode, hold and manipulate information in temporary memory was relatively well-preserved. She scored within the Average range on tasks that required mental rearranging of digits in reverse or ascending order (WAIS-IV Digit Span), immediate recognition of symbols in left to right order (Wechsler Memory Scale-IV (WMS-IV) Symbol Span) or immediate recall of spatial locations within a grid  (WMS-IV Spatial Addition)  Learning & Memory On the WMS-IV, her Immediate Memory Index (IMI), a measure of her ability to recall verbal and visual information immediately after the stimuli was presented, fell within the Low Average range. Her scores on visual immediate memory subtests fell at the 37th percentile while her scores on auditory immediate memory subtests varied from the 9th to 16th percentiles. Her Delayed Memory Index (DMI), a combined measure of her ability to recall verbal and visual information after a 20 to 30 minute delay, also fell within the Low Average range. Her DMI was as expected given her IMI, which indicated that she did not demonstrate abnormal forgetting of learned information after a delay. At a finer level of analysis, however, her delayed recall of visual information was slightly lower than expected given her initial kevel of encoding. A recognition format comprised of multiple choices or yes/no questions did not appreciably aid her delayed memory.   Language From a qualitative perspective, no problems were evident for expressive or receptive communication. Her naming to confrontation [facility] [employer]) fell within the mildly impaired range. For example, she was not able to name drawings of a dart, canoe or beaver. All of her erroneous responses were within the same semantic category. Measures of phonemic fluency (Controlled Oral Word Association Test) and semantic fluency (Animal Naming Test) were within the Average range. Her ability to read words (WRAT-4 Word Reading) fell within the Average range.   Visual-Spatial Perception & Organization There were no signs of spatial inattention or problems with visual recognition. Her ability to assemble two-dimensional block designs from models Counsellor) fell at the lower boundary of the Average range. Her copy of a spatially-complex geometric design (Rey Complex Figure) was defective, which reflected planning and  organizational deficits at an executive level rather than constructional  difficulty per se.   Motor Functions She reported and demonstrated consistent right hand dominance. She did not display signs of gross disscoordination. She demonstrated impaired right hand fine motor speed (Finger Tapping), eye-hand coordination/speed (Grooved Pegboard Test) and grip strength (Hand Dynamometer), which would be consistent with her history of a left brain stroke. In contrast, her left hand motor skills all fell within the Average range.   Executive Function Kelsey Mendez demonstrated significant limitations on tests of executive functions that assessed complex attentional, organizational, reasoning and problem-solving abilities. Her speed to perform a complex visual sequencing test that required the maintenance of an ascending and alternating sequence of numbers and letters (Trails B) fell well within the impaired range. Her copy a spatially-complex design (Rey Figure) was marred by deficient organization and planning as manifested by numerous errors of integration and misplacement. Her performances on tests of verbal or perceptual reasoning (WAIS-IV Similarities & Matrix Reasoning) were below pre-morbid expectations within the Low Average to Borderline ranges. Her performance on a novel problem-solving task that required her to infer abstract rules to sort geometric designs and systematically apply them using ongoing feedback ([employer]) was subnormal as she gave a large number of random responses, six times failed to maintain set (i.e., deviated from a correct strategy for no apparent reason) and became less efficient in her problem-solving as the test progressed. This pattern suggested difficulties both formulating concepts as well holding concepts within working memory.   Emotional Status On the Patient Health Questionnaire, a screening inventory for depression, her score of 10 fell within the  moderate range of depression. It was noteworthy that her most frequently occurring symptoms within the past two weeks reflected vegetative (i.e., trouble falling asleep, fatigue, overeating) or cognitive symptoms (i.e., trouble concentrating) rather than affective distress. She reported having experienced some loss of interest on several days but otherwise denied affective distress.     Summary & Conclusions Kelsey Mendez is a 59 year old right-handed woman who reported persisting cognitive difficulties since she suffered a left brain stroke on 01/16/13. She reported that she currently lives independently and successfully performs her instrumental activities of daily living.  Neuropsychological testing indicated significant deficits in the areas of processing speed, executive functioning (i.e., complex attention, reasoning, set maintenance, planning) and right hand motor skills (consistent with history of left brain stroke).Language functioning was intact with the exception of reduced confrontational naming. Measures of memory functioning fell within the Low Average range, which reflected a decline from her estimated pre-morbid level though also a relative cognitive strength at this time. There were indications of slight forgetting of visual information over time though her delayed recall of auditory information was as expected given her initial level of learning.   From a functional perspective, her deficits for processing speed and executive functions would make the processing of complex information in "real world" situations more time-consuming for her as well as disrupt her abilities to learn and retrieve new information (despite her relative strength on structured memory testing), efficiently make decisions or solve non-routine problems. Her reduced dominant hand motor speed/dexterity/strength could slow her speed of handwriting and her performance of tasks that require manipulation of small parts.    A comparison of her current neuropsychological profile to the prior assessment of January 2015 suggested that her natural post-stroke cognitive recovery has reached a plateau.  With regards to her psychological functioning, she is worried about her father's declining health. She otherwise reported being mostly in good spirits, having  good coping resources and having no difficulties with self-regulation or psychosocial adjustment.  Discussion/Recommendations 1. It appears that Kelsey Mendez has benefitted from learning compensatory memory and organizational strategies from her Public relations account executive. She cited her biggest problems as formulating ideas and making decisions, which likely reflect her reduced reasoning abilities (especially for divergent and convergent thinking) as well as problems maintaining cognitive set. Cognitive rehabilitation treatment approaches to improve reasoning and problem-solving tend not to generalize from the office to everyday life and also lack empirical validation. Therefore, it cannot be stated with a reasonable degree of certainty that her quality of life could be improved by further cognitive rehabilitation treatment at this point.   2. She is not deemed to be in need of mental health services at this time. Most of her current emotional distress appears to reflect a reasonable reaction to the declining health of her father. In any case, the availability of psychological counseling services and a local stroke support group was discussed with her. Finally, she was told that escitalopram should be taken every day as prescribed and not on an as needed basis.   3. Due to her cognitive deficits, Kelsey Mendez would unlikely be successful at most competitive employment, including her former position. She currently expresses ambivalence about attempting a return to employment. 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   I have appreciated the opportunity to evaluate Kelsey Mendez. Please feel free to contact me with any comments or questions.     ______________________ Gladstone Pih, Ph.D Licensed Psychologist     ADDENDUM-NEUROPSYCHOLOGICAL TEST RESULTS Animal Naming Test Score= 15 31st (adjusted for age, gender, race and educational level)   [place] Naming Test Score= [phone]  4th (adjusted for age, gender, race and educational level)   Controlled Oral Word Association Test Score=  35 words/0 repetitions 34th (adjusted for age, gender, race and educational level)   Finger Tapping R: 46 10th  (adjusted for age, gender, race and educational level)  L: 53 38th  (adjusted for age, gender, race and educational level)   Grooved Pegboard R: 128s  2nd   (adjusted for age, gender, race and educational level)  L: 100s 31st   (adjusted for age, gender, race and educational level)   Hand Dynamometer R: 19 kg.  2nd  (adjusted for age, gender, race and educational level)  L:  31 kg. 70th   (adjusted for age, gender, race and educational level)   Rey Complex Figure: copy       Score= 25/36  Abnormal organization marked by numerous errors of integration or placement   Rey 15-Item Memory Test Score= 15/15 Above cut-off   Trails A Score=   76s  0e  1st  (adjusted for age, gender, race and educational level)  Trails B Score= 330s  2e <1st (adjusted for age, gender, race and educational level)   Wechsler Adult Intelligence Scale-IV Subtest Scaled Score Percentile  Block Design   8 25th   Similarities   5   5th   Digit Span   Forward            Backward            Sequencing   8   9   8   9  25th         37th  25th   37th   Matrix Reasoning   6   9th     Coding     4   2nd    Wechsler Memory Scale-IV   Index Index Score Percentile  Immediate Memory 84 14th   Auditory Memory 84 14th      Visual Memory 87 19th    Delayed Memory 82 12th    Visual Working Memory 94 34th      Wide Range Achievement Test-4 Subtest  Raw score Standard score Percentile  Word Reading 59/70 95 37th    [employer] Test  Total errors=  41 12th (adjusted for age and education)  Perseverative errors=  20 18th     Categories=    2 6th - 10th   Trials to first category= 13 >16th     Failure to maintain set   6 <1st    Learning to learn= -11.46  2nd - 5th

## 2014-11-21 ENCOUNTER — Encounter: Payer: Self-pay | Admitting: Psychology

## 2015-10-21 ENCOUNTER — Emergency Department (HOSPITAL_COMMUNITY)
Admission: EM | Admit: 2015-10-21 | Discharge: 2015-10-21 | Disposition: A | Discharge status: Home / Self Care | Payer: PPO | Attending: Emergency Medicine | Admitting: Emergency Medicine

## 2015-10-21 ENCOUNTER — Emergency Department (HOSPITAL_COMMUNITY): Payer: PPO

## 2015-10-21 ENCOUNTER — Encounter (HOSPITAL_COMMUNITY): Payer: Self-pay | Admitting: Emergency Medicine

## 2015-10-21 DIAGNOSIS — S80212A Abrasion, left knee, initial encounter: Secondary | ICD-10-CM | POA: Diagnosis not present

## 2015-10-21 DIAGNOSIS — Y939 Activity, unspecified: Secondary | ICD-10-CM | POA: Diagnosis not present

## 2015-10-21 DIAGNOSIS — Z23 Encounter for immunization: Secondary | ICD-10-CM | POA: Diagnosis not present

## 2015-10-21 DIAGNOSIS — W19XXXA Unspecified fall, initial encounter: Secondary | ICD-10-CM | POA: Diagnosis not present

## 2015-10-21 DIAGNOSIS — S8002XA Contusion of left knee, initial encounter: Secondary | ICD-10-CM | POA: Diagnosis not present

## 2015-10-21 DIAGNOSIS — S8992XA Unspecified injury of left lower leg, initial encounter: Secondary | ICD-10-CM | POA: Diagnosis not present

## 2015-10-21 DIAGNOSIS — M25462 Effusion, left knee: Secondary | ICD-10-CM | POA: Insufficient documentation

## 2015-10-21 DIAGNOSIS — I10 Essential (primary) hypertension: Secondary | ICD-10-CM | POA: Insufficient documentation

## 2015-10-21 DIAGNOSIS — Z79899 Other long term (current) drug therapy: Secondary | ICD-10-CM | POA: Diagnosis not present

## 2015-10-21 DIAGNOSIS — Z87891 Personal history of nicotine dependence: Secondary | ICD-10-CM | POA: Diagnosis not present

## 2015-10-21 DIAGNOSIS — Y999 Unspecified external cause status: Secondary | ICD-10-CM | POA: Insufficient documentation

## 2015-10-21 DIAGNOSIS — Z8673 Personal history of transient ischemic attack (TIA), and cerebral infarction without residual deficits: Secondary | ICD-10-CM | POA: Insufficient documentation

## 2015-10-21 DIAGNOSIS — Y92481 Parking lot as the place of occurrence of the external cause: Secondary | ICD-10-CM | POA: Insufficient documentation

## 2015-10-21 DIAGNOSIS — Z7982 Long term (current) use of aspirin: Secondary | ICD-10-CM | POA: Insufficient documentation

## 2015-10-21 MED ORDER — DICLOFENAC SODIUM 75 MG PO TBEC: 75.0000 mg | DELAYED_RELEASE_TABLET | Freq: Two times a day (BID) | ORAL | Status: DC

## 2015-10-21 MED ORDER — ACETAMINOPHEN-CODEINE #3 300-30 MG PO TABS
2.0000 | ORAL_TABLET | Freq: Once | ORAL | Status: AC
  Administered 2015-10-21: 2 via ORAL
  Filled 2015-10-21: qty 2

## 2015-10-21 MED ORDER — ACETAMINOPHEN-CODEINE #3 300-30 MG PO TABS: 1.0000 | ORAL_TABLET | Freq: Four times a day (QID) | ORAL | Status: AC | PRN

## 2015-10-21 MED ORDER — TETANUS-DIPHTH-ACELL PERTUSSIS 5-2.5-18.5 LF-MCG/0.5 IM SUSP
0.5000 mL | Freq: Once | INTRAMUSCULAR | Status: AC
  Administered 2015-10-21: 0.5 mL via INTRAMUSCULAR
  Filled 2015-10-21: qty 0.5

## 2015-10-21 MED ORDER — KETOROLAC TROMETHAMINE 10 MG PO TABS
10.0000 mg | ORAL_TABLET | Freq: Once | ORAL | Status: AC
  Administered 2015-10-21: 10 mg via ORAL
  Filled 2015-10-21: qty 1

## 2015-10-21 NOTE — Discharge Instructions (Signed)
Your x-ray reveals several areas of arthritis, and a small area of fluid or effusion in the knee. No evidence of any fracture or dislocation. Please use ice pack for 15-20 minute intervals. Please use the Ace wrap over the next 4 or 5 days. Please use a walker to get around until you can bear weight on the left lower extremity. Please see Dr. Romeo Apple for additional orthopedic evaluation if not improving. Use diclofenac 2 times daily with a meal. Use Tylenol codeine for more severe pain. Tylenol codeine may cause drowsiness, please use this medication with caution. Knee Effusion Knee effusion means that you have excess fluid in your knee joint. This can cause pain and swelling in your knee. This may make your knee more difficult to bend and move. That is because there is increased pain and pressure in the joint. If there is fluid in your knee, it often means that something is wrong inside your knee, such as severe arthritis, abnormal inflammation, or an infection. Another common cause of knee effusion is an injury to the knee muscles, ligaments, or cartilage. HOME CARE INSTRUCTIONS  Use crutches as directed by your health care provider.  Wear a knee brace as directed by your health care provider.  Apply ice to the swollen area:  Put ice in a plastic bag.  Place a towel between your skin and the bag.  Leave the ice on for 20 minutes, 2-3 times per day.  Keep your knee raised (elevated) when you are sitting or lying down.  Take medicines only as directed by your health care provider.  Do any rehabilitation or strengthening exercises as directed by your health care provider.  Rest your knee as directed by your health care provider. You may start doing your normal activities again when your health care provider approves.   Keep all follow-up visits as directed by your health care provider. This is important. SEEK MEDICAL CARE IF:  You have ongoing (persistent) pain in your knee. SEEK  IMMEDIATE MEDICAL CARE IF:  You have increased swelling or redness of your knee.  You have severe pain in your knee.  You have a fever.   This information is not intended to replace advice given to you by your health care provider. Make sure you discuss any questions you have with your health care provider.   Document Released: 07/31/2003 Document Revised: 05/31/2014 Document Reviewed: 12/24/2013 Elsevier Interactive Patient Education 2016 Elsevier Inc.  Contusion A contusion is a deep bruise. Contusions happen when an injury causes bleeding under the skin. Symptoms of bruising include pain, swelling, and discolored skin. The skin may turn blue, purple, or yellow. HOME CARE   Rest the injured area.  If told, put ice on the injured area.  Put ice in a plastic bag.  Place a towel between your skin and the bag.  Leave the ice on for 20 minutes, 2-3 times per day.  If told, put light pressure (compression) on the injured area using an elastic bandage. Make sure the bandage is not too tight. Remove it and put it back on as told by your doctor.  If possible, raise (elevate) the injured area above the level of your heart while you are sitting or lying down.  Take over-the-counter and prescription medicines only as told by your doctor. GET HELP IF:  Your symptoms do not get better after several days of treatment.  Your symptoms get worse.  You have trouble moving the injured area. GET HELP RIGHT AWAY IF:  You have very bad pain.  You have a loss of feeling (numbness) in a hand or foot.  Your hand or foot turns pale or cold.   This information is not intended to replace advice given to you by your health care provider. Make sure you discuss any questions you have with your health care provider.   Document Released: 10/27/2007 Document Revised: 01/29/2015 Document Reviewed: 09/25/2014 Elsevier Interactive Patient Education [employer]2016 Elsevier Inc.

## 2015-10-21 NOTE — ED Notes (Signed)
Pt reports falling in a parking lot yesterday. Pt c/o LT leg pain, primarily in the knee. Pt noted to have abrasion to LT knee and to palms of hand. Pt reports difficulty ambulating due to pain.

## 2015-10-21 NOTE — ED Provider Notes (Signed)
CSN: [id number]     Arrival date & time 10/21/15  1111 History   First MD Initiated Contact with Patient 10/21/15 1231     Chief Complaint  Patient presents with  . Fall     (Consider location/radiation/quality/duration/timing/severity/associated sxs/prior Treatment) HPI Comments: Patient is a 60 year old female who presents to the emergency department with left knee pain.  The patient states that on yesterday while in a parking lot she sustained a fall. She thinks that her leg may have given away on her, but she is not sure. It is of note that she has had a cerebrovascular accident in the past. The patient complains of pain and swelling of the left knee. She also complains of soreness of the palms of her hands where she attempted to break the fall. She states that she has pain and problems when she attempts to get off of the chair or off of the commode. She has a great deal of pain if she attempts to put weight on the left face. There is less discomfort involving the hip on the left or the ankle. The patient is not had any previous operations or procedures involving this area. The patient denies being on any anticoagulation medications. She has tried Tylenol without any improvement. She's tried resting the knee, but states that when she moves to get up or sit down it hurts even more.  The history is provided by the patient.    Past Medical History  Diagnosis Date  . Stroke (HCC)   . Hypertension   . Osteoporosis     per patient   Past Surgical History  Procedure Laterality Date  . Cesarean section    . Tonsillectomy    . Hernia repair     Family History  Problem Relation Age of Onset  . Hypertension Mother   . Diabetes Mother   . Cancer Father   . Hypertension Father   . Glaucoma Father   . Congestive Heart Failure Sister   . Sarcoidosis Sister   . Cancer Father     stage 4    Social History  Substance Use Topics  . Smoking status: Former Smoker -- 0.25 packs/day for 9  years    Types: Cigarettes    Quit date: 01/16/2013  . Smokeless tobacco: None  . Alcohol Use: No   OB History    No data available     Review of Systems  Musculoskeletal: Positive for arthralgias.  All other systems reviewed and are negative.     Allergies  Review of patient's allergies indicates no known allergies.  Home Medications   Prior to Admission medications   Medication Sig Start Date End Date Taking? Authorizing Provider  aspirin ([facility] ASPIRIN) 325 MG tablet Take 325 mg by mouth daily.    Historical Provider, MD  atorvastatin (LIPITOR) 40 MG tablet Take 40 mg by mouth daily.    Historical Provider, MD  escitalopram (LEXAPRO) 10 MG tablet Take 10 mg by mouth daily.    Historical Provider, MD  metoprolol tartrate (LOPRESSOR) 25 MG tablet Take 25 mg by mouth 2 (two) times daily.    Historical Provider, MD  topiramate (TOPAMAX) 50 MG tablet Take 50 mg by mouth at bedtime.    Historical Provider, MD   BP 154/88 mmHg  Pulse 73  Temp(Src) 98.2 F (36.8 C) (Tympanic)  Resp 16  Ht  (1.676 m)  Wt 131.543 kg  BMI 46.83 kg/m2  SpO2 100% Physical Exam  Constitutional: She is  oriented to person, place, and time. She appears well-developed and well-nourished.  Non-toxic appearance.  HENT:  Head: Normocephalic.  Right Ear: Tympanic membrane and external ear normal.  Left Ear: Tympanic membrane and external ear normal.  Eyes: EOM and lids are normal. Pupils are equal, round, and reactive to light.  Neck: Normal range of motion. Neck supple. Carotid bruit is not present.  Cardiovascular: Normal rate, regular rhythm, normal heart sounds, intact distal pulses and normal pulses.   Pulmonary/Chest: Breath sounds normal. No respiratory distress.  Abdominal: Soft. Bowel sounds are normal. There is no tenderness. There is no guarding.  Musculoskeletal: Normal range of motion.  There is no deformity of the quadricep area on the left. There are multiple abrasions involving the  anterior left knee. There is mild effusion present. There's no deformity appreciated. There's no deformity of the patella tendon. No deformity of the tibial area on the left. The Achilles tendon is intact. The dorsalis pedis pulses 2+.  Lymphadenopathy:       Head (right side): No submandibular adenopathy present.       Head (left side): No submandibular adenopathy present.    She has no cervical adenopathy.  Neurological: She is alert and oriented to person, place, and time. She has normal strength. No cranial nerve deficit or sensory deficit.  Skin: Skin is warm and dry.  Psychiatric: She has a normal mood and affect. Her speech is normal.  Nursing note and vitals reviewed.   ED Course  Procedures (including critical care time) Labs Review Labs Reviewed - No data to display  Imaging Review Dg Knee Complete 4 Views Left  10/21/2015  CLINICAL DATA:  Fall EXAM: LEFT KNEE - COMPLETE 4+ VIEW COMPARISON:  None. FINDINGS: Moderate degenerate change in the medial compartment with joint space narrowing and spurring. Lateral joint compartment normal. Mild degenerative change in the patella. Small joint effusion Negative for fracture.  No loose body. IMPRESSION: Moderate degenerative change in the medial joint compartment with joint effusion. Negative for fracture. Electronically Signed   By: Marlan Palauharles  Clark M.D.   On: 10/21/2015 11:53   I have personally reviewed and evaluated these images and lab results as part of my medical decision-making.   EKG Interpretation None      MDM  Xray of the left knee is negative for fx or dislocation. Pt has DJD changes with effusion. No neurovascular changes present. ACE wrap applied Pt given tylenol #3, diclofenac, and rx for a walker. Pt referred to orthopedics.   Final diagnoses:  Effusion of knee, left  Contusion of left knee, initial encounter  Abrasion of left knee, initial encounter    **I have reviewed nursing notes, vital signs, and all  appropriate lab and imaging results for this patient.Ivery Quale*    Manisha Cancel, PA-C 10/22/15 [id number]  Blane OharaJoshua Zavitz, MD 10/22/15 [phone]

## 2015-12-17 DIAGNOSIS — E782 Mixed hyperlipidemia: Secondary | ICD-10-CM | POA: Diagnosis not present

## 2015-12-17 DIAGNOSIS — I10 Essential (primary) hypertension: Secondary | ICD-10-CM | POA: Diagnosis not present

## 2015-12-17 DIAGNOSIS — E119 Type 2 diabetes mellitus without complications: Secondary | ICD-10-CM | POA: Diagnosis not present

## 2016-02-18 DIAGNOSIS — E559 Vitamin D deficiency, unspecified: Secondary | ICD-10-CM | POA: Diagnosis not present

## 2016-02-18 DIAGNOSIS — E119 Type 2 diabetes mellitus without complications: Secondary | ICD-10-CM | POA: Diagnosis not present

## 2016-02-18 DIAGNOSIS — E782 Mixed hyperlipidemia: Secondary | ICD-10-CM | POA: Diagnosis not present

## 2016-06-07 DIAGNOSIS — E782 Mixed hyperlipidemia: Secondary | ICD-10-CM | POA: Diagnosis not present

## 2016-06-07 DIAGNOSIS — E119 Type 2 diabetes mellitus without complications: Secondary | ICD-10-CM | POA: Diagnosis not present

## 2016-06-07 DIAGNOSIS — E559 Vitamin D deficiency, unspecified: Secondary | ICD-10-CM | POA: Diagnosis not present

## 2016-10-05 DIAGNOSIS — I10 Essential (primary) hypertension: Secondary | ICD-10-CM | POA: Diagnosis not present

## 2016-10-05 DIAGNOSIS — E782 Mixed hyperlipidemia: Secondary | ICD-10-CM | POA: Diagnosis not present

## 2016-10-05 DIAGNOSIS — E119 Type 2 diabetes mellitus without complications: Secondary | ICD-10-CM | POA: Diagnosis not present

## 2016-10-05 DIAGNOSIS — E559 Vitamin D deficiency, unspecified: Secondary | ICD-10-CM | POA: Diagnosis not present

## 2017-03-14 DIAGNOSIS — I10 Essential (primary) hypertension: Secondary | ICD-10-CM | POA: Diagnosis not present

## 2017-03-14 DIAGNOSIS — Z7901 Long term (current) use of anticoagulants: Secondary | ICD-10-CM | POA: Diagnosis not present

## 2017-03-14 DIAGNOSIS — E782 Mixed hyperlipidemia: Secondary | ICD-10-CM | POA: Diagnosis not present

## 2017-03-14 DIAGNOSIS — I69314 Frontal lobe and executive function deficit following cerebral infarction: Secondary | ICD-10-CM | POA: Diagnosis not present

## 2017-07-19 DIAGNOSIS — I639 Cerebral infarction, unspecified: Secondary | ICD-10-CM | POA: Diagnosis not present

## 2017-07-19 DIAGNOSIS — I10 Essential (primary) hypertension: Secondary | ICD-10-CM | POA: Diagnosis not present

## 2017-07-19 DIAGNOSIS — E119 Type 2 diabetes mellitus without complications: Secondary | ICD-10-CM | POA: Diagnosis not present

## 2017-07-28 DIAGNOSIS — I10 Essential (primary) hypertension: Secondary | ICD-10-CM | POA: Diagnosis not present

## 2017-07-28 DIAGNOSIS — E782 Mixed hyperlipidemia: Secondary | ICD-10-CM | POA: Diagnosis not present

## 2017-07-28 DIAGNOSIS — E119 Type 2 diabetes mellitus without complications: Secondary | ICD-10-CM | POA: Diagnosis not present

## 2017-08-17 DIAGNOSIS — I69351 Hemiplegia and hemiparesis following cerebral infarction affecting right dominant side: Secondary | ICD-10-CM | POA: Diagnosis not present

## 2017-08-17 DIAGNOSIS — I69318 Other symptoms and signs involving cognitive functions following cerebral infarction: Secondary | ICD-10-CM | POA: Diagnosis not present

## 2017-08-25 DIAGNOSIS — Z961 Presence of intraocular lens: Secondary | ICD-10-CM | POA: Diagnosis not present

## 2017-08-25 DIAGNOSIS — H52223 Regular astigmatism, bilateral: Secondary | ICD-10-CM | POA: Diagnosis not present

## 2017-08-25 DIAGNOSIS — H524 Presbyopia: Secondary | ICD-10-CM | POA: Diagnosis not present

## 2017-10-05 DIAGNOSIS — A599 Trichomoniasis, unspecified: Secondary | ICD-10-CM | POA: Diagnosis not present

## 2017-11-14 ENCOUNTER — Other Ambulatory Visit (HOSPITAL_COMMUNITY): Payer: Self-pay | Admitting: Family Medicine

## 2017-11-14 ENCOUNTER — Ambulatory Visit (HOSPITAL_COMMUNITY): Payer: PPO

## 2017-11-14 DIAGNOSIS — R252 Cramp and spasm: Secondary | ICD-10-CM | POA: Diagnosis not present

## 2017-11-14 DIAGNOSIS — E782 Mixed hyperlipidemia: Secondary | ICD-10-CM | POA: Diagnosis not present

## 2017-11-14 DIAGNOSIS — Z1231 Encounter for screening mammogram for malignant neoplasm of breast: Secondary | ICD-10-CM

## 2017-11-14 DIAGNOSIS — I10 Essential (primary) hypertension: Secondary | ICD-10-CM | POA: Diagnosis not present

## 2017-11-14 DIAGNOSIS — M79671 Pain in right foot: Secondary | ICD-10-CM

## 2017-11-14 DIAGNOSIS — E119 Type 2 diabetes mellitus without complications: Secondary | ICD-10-CM | POA: Diagnosis not present

## 2017-11-16 ENCOUNTER — Other Ambulatory Visit (HOSPITAL_COMMUNITY): Payer: Self-pay | Admitting: Family Medicine

## 2017-11-16 ENCOUNTER — Ambulatory Visit (HOSPITAL_COMMUNITY)
Admission: RE | Admit: 2017-11-16 | Discharge: 2017-11-16 | Disposition: A | Discharge status: Home / Self Care | Payer: PPO | Source: Ambulatory Visit | Attending: Family Medicine | Admitting: Family Medicine

## 2017-11-16 DIAGNOSIS — R52 Pain, unspecified: Secondary | ICD-10-CM

## 2017-11-16 DIAGNOSIS — M79671 Pain in right foot: Secondary | ICD-10-CM | POA: Insufficient documentation

## 2017-11-16 DIAGNOSIS — M7661 Achilles tendinitis, right leg: Secondary | ICD-10-CM | POA: Insufficient documentation

## 2017-11-16 DIAGNOSIS — Z1231 Encounter for screening mammogram for malignant neoplasm of breast: Secondary | ICD-10-CM | POA: Insufficient documentation

## 2017-12-13 DIAGNOSIS — M79671 Pain in right foot: Secondary | ICD-10-CM | POA: Diagnosis not present

## 2017-12-13 DIAGNOSIS — A599 Trichomoniasis, unspecified: Secondary | ICD-10-CM | POA: Diagnosis not present

## 2018-02-15 DIAGNOSIS — I10 Essential (primary) hypertension: Secondary | ICD-10-CM | POA: Diagnosis not present

## 2018-02-15 DIAGNOSIS — E782 Mixed hyperlipidemia: Secondary | ICD-10-CM | POA: Diagnosis not present

## 2018-02-15 DIAGNOSIS — M67961 Unspecified disorder of synovium and tendon, right lower leg: Secondary | ICD-10-CM | POA: Diagnosis not present

## 2018-05-09 DIAGNOSIS — R21 Rash and other nonspecific skin eruption: Secondary | ICD-10-CM | POA: Diagnosis not present

## 2018-05-09 DIAGNOSIS — I69314 Frontal lobe and executive function deficit following cerebral infarction: Secondary | ICD-10-CM | POA: Diagnosis not present

## 2018-05-09 DIAGNOSIS — I10 Essential (primary) hypertension: Secondary | ICD-10-CM | POA: Diagnosis not present

## 2018-11-10 ENCOUNTER — Other Ambulatory Visit: Payer: PPO

## 2018-11-10 ENCOUNTER — Other Ambulatory Visit: Payer: Self-pay | Admitting: Internal Medicine

## 2018-11-10 DIAGNOSIS — Z20822 Contact with and (suspected) exposure to covid-19: Secondary | ICD-10-CM

## 2018-11-10 DIAGNOSIS — R6889 Other general symptoms and signs: Secondary | ICD-10-CM | POA: Diagnosis not present

## 2018-11-16 LAB — NOVEL CORONAVIRUS, NAA: SARS-CoV-2, NAA: NOT DETECTED

## 2018-11-27 ENCOUNTER — Other Ambulatory Visit: Payer: PPO

## 2018-11-27 ENCOUNTER — Other Ambulatory Visit: Payer: Self-pay

## 2018-11-27 DIAGNOSIS — Z20822 Contact with and (suspected) exposure to covid-19: Secondary | ICD-10-CM

## 2018-11-27 DIAGNOSIS — R6889 Other general symptoms and signs: Secondary | ICD-10-CM | POA: Diagnosis not present

## 2018-12-02 LAB — NOVEL CORONAVIRUS, NAA: SARS-CoV-2, NAA: NOT DETECTED

## 2019-01-09 ENCOUNTER — Other Ambulatory Visit (HOSPITAL_COMMUNITY): Payer: Self-pay | Admitting: Family Medicine

## 2019-01-09 DIAGNOSIS — Z1231 Encounter for screening mammogram for malignant neoplasm of breast: Secondary | ICD-10-CM

## 2019-03-01 ENCOUNTER — Other Ambulatory Visit: Payer: Self-pay | Admitting: *Deleted

## 2019-03-01 DIAGNOSIS — Z20828 Contact with and (suspected) exposure to other viral communicable diseases: Secondary | ICD-10-CM | POA: Diagnosis not present

## 2019-03-01 DIAGNOSIS — Z20822 Contact with and (suspected) exposure to covid-19: Secondary | ICD-10-CM

## 2019-03-03 LAB — NOVEL CORONAVIRUS, NAA: SARS-CoV-2, NAA: NOT DETECTED

## 2019-05-15 DIAGNOSIS — I10 Essential (primary) hypertension: Secondary | ICD-10-CM | POA: Diagnosis not present

## 2019-05-15 DIAGNOSIS — R519 Headache, unspecified: Secondary | ICD-10-CM | POA: Diagnosis not present

## 2019-05-15 DIAGNOSIS — E782 Mixed hyperlipidemia: Secondary | ICD-10-CM | POA: Diagnosis not present

## 2019-05-15 DIAGNOSIS — E119 Type 2 diabetes mellitus without complications: Secondary | ICD-10-CM | POA: Diagnosis not present

## 2020-02-27 DIAGNOSIS — I10 Essential (primary) hypertension: Secondary | ICD-10-CM | POA: Diagnosis not present

## 2020-02-27 DIAGNOSIS — E782 Mixed hyperlipidemia: Secondary | ICD-10-CM | POA: Diagnosis not present

## 2020-02-27 DIAGNOSIS — I69351 Hemiplegia and hemiparesis following cerebral infarction affecting right dominant side: Secondary | ICD-10-CM | POA: Diagnosis not present

## 2020-03-25 DIAGNOSIS — I69351 Hemiplegia and hemiparesis following cerebral infarction affecting right dominant side: Secondary | ICD-10-CM | POA: Diagnosis not present

## 2020-03-25 DIAGNOSIS — Z7189 Other specified counseling: Secondary | ICD-10-CM | POA: Diagnosis not present

## 2020-03-25 DIAGNOSIS — I10 Essential (primary) hypertension: Secondary | ICD-10-CM | POA: Diagnosis not present

## 2020-03-25 DIAGNOSIS — E782 Mixed hyperlipidemia: Secondary | ICD-10-CM | POA: Diagnosis not present

## 2020-03-28 DIAGNOSIS — I10 Essential (primary) hypertension: Secondary | ICD-10-CM | POA: Diagnosis not present

## 2020-03-28 DIAGNOSIS — R7301 Impaired fasting glucose: Secondary | ICD-10-CM | POA: Diagnosis not present

## 2020-05-01 ENCOUNTER — Ambulatory Visit: Payer: PPO

## 2020-11-10 DIAGNOSIS — Z79899 Other long term (current) drug therapy: Secondary | ICD-10-CM | POA: Diagnosis not present

## 2020-11-10 DIAGNOSIS — E559 Vitamin D deficiency, unspecified: Secondary | ICD-10-CM | POA: Diagnosis not present

## 2020-11-10 DIAGNOSIS — Z7901 Long term (current) use of anticoagulants: Secondary | ICD-10-CM | POA: Diagnosis not present

## 2020-11-10 DIAGNOSIS — I69351 Hemiplegia and hemiparesis following cerebral infarction affecting right dominant side: Secondary | ICD-10-CM | POA: Diagnosis not present

## 2020-11-10 DIAGNOSIS — Z7189 Other specified counseling: Secondary | ICD-10-CM | POA: Diagnosis not present

## 2020-11-10 DIAGNOSIS — E782 Mixed hyperlipidemia: Secondary | ICD-10-CM | POA: Diagnosis not present

## 2020-11-10 DIAGNOSIS — E119 Type 2 diabetes mellitus without complications: Secondary | ICD-10-CM | POA: Diagnosis not present

## 2020-11-10 DIAGNOSIS — I10 Essential (primary) hypertension: Secondary | ICD-10-CM | POA: Diagnosis not present

## 2020-11-10 DIAGNOSIS — I639 Cerebral infarction, unspecified: Secondary | ICD-10-CM | POA: Diagnosis not present

## 2020-12-08 DIAGNOSIS — I10 Essential (primary) hypertension: Secondary | ICD-10-CM | POA: Diagnosis not present

## 2020-12-08 DIAGNOSIS — R519 Headache, unspecified: Secondary | ICD-10-CM | POA: Diagnosis not present

## 2021-01-06 DIAGNOSIS — R519 Headache, unspecified: Secondary | ICD-10-CM | POA: Diagnosis not present

## 2021-01-06 DIAGNOSIS — I10 Essential (primary) hypertension: Secondary | ICD-10-CM | POA: Diagnosis not present

## 2021-03-30 ENCOUNTER — Ambulatory Visit: Payer: PPO | Admitting: Diagnostic Neuroimaging

## 2021-03-30 ENCOUNTER — Encounter: Payer: Self-pay | Admitting: Diagnostic Neuroimaging

## 2021-03-30 ENCOUNTER — Telehealth: Payer: Self-pay | Admitting: Diagnostic Neuroimaging

## 2021-03-30 VITALS — BP 118/86 | HR 62 | Ht 66.5 in | Wt 339.5 lb

## 2021-03-30 DIAGNOSIS — I63412 Cerebral infarction due to embolism of left middle cerebral artery: Secondary | ICD-10-CM

## 2021-03-30 DIAGNOSIS — M79604 Pain in right leg: Secondary | ICD-10-CM | POA: Diagnosis not present

## 2021-03-30 DIAGNOSIS — Z6841 Body Mass Index (BMI) 40.0 and over, adult: Secondary | ICD-10-CM

## 2021-03-30 MED ORDER — TOPIRAMATE 50 MG PO TABS: 50.0000 mg | ORAL_TABLET | Freq: Two times a day (BID) | ORAL | 12 refills | Status: AC

## 2021-03-30 MED ORDER — UBRELVY 100 MG PO TABS
100.0000 mg | ORAL_TABLET | ORAL | 12 refills | Status: AC | PRN
Start: 2021-03-30 — End: ?

## 2021-03-30 NOTE — Patient Instructions (Signed)
  STROKE PREVENTION - continue aspirin, statin, BP control for secondary stroke prevention - nutrition and exercise strategies reviewed (protein intake, plants) - refer to PT evaluation (leg pain, knee pain)  MIGRAINE WITH AURA + TENSION HEADACHES - restart topiramate 50mg  twice a day - ubrelvy 100mg  as needed  FATIGUE / OBESITY - refer to sleep study - refer to weight mgmt clinic

## 2021-03-30 NOTE — Telephone Encounter (Signed)
PT referral has been sent to [facility] in [place]. Phone: [phone].

## 2021-03-30 NOTE — Progress Notes (Signed)
[facility]  PATIENT: Kelsey Mendez DOB: [birth year]  REFERRING CLINICIAN: Dillard Essex, NP  HISTORY FROM: patient  REASON FOR VISIT: new consult    HISTORICAL  CHIEF COMPLAINT:  Chief Complaint  Patient presents with   New Patient (Initial Visit)    Rm 7 alone here for consult on worsening h/a. Pt reports h/a have increased since her strokes. Pt does report associated right leg pain when h/a are present. Has been tried on topamax, not much benefit has been noted.    HISTORY OF PRESENT ILLNESS:   UPDATE (03/30/21, VRP): Since last visit, having HA, occipital, photophobia, phonophobia, pressure and pain. 2-3 HA per week. Tried walking on track 5 minutes per day for exercise, but this aggravated right leg pain. Trying to improve nutrition (eggs in AM; salad with chicken or fish at dinner; skipping lunch).  UPDATE 10/09/14: Since last visit, patient is stable. Has tried PT, ST. Still with right sided weakness and pain. Some headaches. Some stress related to her parents (father stage 4 cancer; mother leaky heart valve).   PRIOR HPI (04/10/14): 65 year old female with hypertension, here for evaluation of post stroke memory loss and right leg pain. 01/16/2013 patient was living in [place], at work, had headache and took an aspirin. She is having difficulty fixing her computer. IT help came in was easily able to fix the computer. Patient drove home. Patient noticed her words were not coming out correctly. Next day patient had elevated blood pressure. The next day patient was having more slurred speech and went to the hospital. She had right arm and right leg weakness as well. Patient was diagnosed with a stroke, evaluated and then discharged with speech therapy, physical neck basal therapy. Patient moved to [facility] in June 2015. Patient still has ongoing cognitive problems, memory difficulty, right leg pain. Patient also having intermittent headaches in the  evening. Sometimes she has photophobia and throbbing sensation. Patient was prescribed Topamax which has helped a little bit.   REVIEW OF SYSTEMS: Full 14 system review of systems performed and negative except: fatigue headaches.  ALLERGIES: Allergies  Allergen Reactions   Chlorthalidone    Hydrochlorothiazide     HOME MEDICATIONS: Outpatient Medications Prior to Visit  Medication Sig Dispense Refill   acetaminophen-codeine (TYLENOL #3) 300-30 MG tablet Take 1-2 tablets by mouth every 6 (six) hours as needed. 20 tablet 0   amLODipine (NORVASC) 10 MG tablet Take 10 mg by mouth daily.     aspirin 325 MG tablet Take 325 mg by mouth daily.     atorvastatin (LIPITOR) 40 MG tablet Take 40 mg by mouth daily.     escitalopram (LEXAPRO) 10 MG tablet Take 10 mg by mouth daily.     metoprolol succinate (TOPROL-XL) 50 MG 24 hr tablet Take 50 mg by mouth daily. Take with or immediately following a meal.     topiramate (TOPAMAX) 50 MG tablet Take 50 mg by mouth at bedtime.     diclofenac (VOLTAREN) 75 MG EC tablet Take 1 tablet (75 mg total) by mouth 2 (two) times daily. (Patient not taking: Reported on 03/30/2021) 14 tablet 0   No facility-administered medications prior to visit.    PAST MEDICAL HISTORY: Past Medical History:  Diagnosis Date   Anxiety    Diabetes mellitus without complication (HCC)    Hemiplegia (HCC)    Hyperlipidemia    Hypertension    Osteoporosis    per patient   Stroke [employer])    Vitamin  D deficiency     PAST SURGICAL HISTORY: Past Surgical History:  Procedure Laterality Date   CESAREAN SECTION     HERNIA REPAIR     TONSILLECTOMY      FAMILY HISTORY: Family History  Problem Relation Age of Onset   Hypertension Mother    Diabetes Mother    Cancer Father    Hypertension Father    Glaucoma Father    Congestive Heart Failure Sister    Sarcoidosis Sister    Cancer Father        stage 4     SOCIAL HISTORY:  Social History   Socioeconomic History    Marital status: Single    Spouse name: Not on file   Number of children: 1   Years of education: BS   Highest education level: Not on file  Occupational History    Employer: OTHER    Comment: disabled  Tobacco Use   Smoking status: Former    Packs/day: 0.25    Years: 9.00    Pack years: 2.25    Types: Cigarettes    Quit date: 01/16/2013    Years since quitting: 8.2   Smokeless tobacco: Never  Substance and Sexual Activity   Alcohol use: No    Alcohol/week: 0.0 standard drinks   Drug use: No   Sexual activity: Not on file  Other Topics Concern   Not on file  Social History Narrative   Patient lives at home alone.   Caffeine Use: 2-3 cups daily   Right handed   Social Determinants of Health   Financial Resource Strain: Not on file  Food Insecurity: Not on file  Transportation Needs: Not on file  Physical Activity: Not on file  Stress: Not on file  Social Connections: Not on file  Intimate Partner Violence: Not on file     PHYSICAL EXAM  Vitals:   03/30/21 0820  BP: 118/86  Pulse: 62  SpO2: 96%  Weight: (!) 339 lb 8 oz (154 kg)  Height: 5' 6.5" (1.689 m)   Body mass index is 53.98 kg/m.  No results found.  No flowsheet data found.    GENERAL EXAM: Patient is in no distress; well developed, nourished and groomed; neck is supple  CARDIOVASCULAR: Regular rate and rhythm, no murmurs, no carotid bruits  NEUROLOGIC: MENTAL STATUS: awake, alert, language fluent, comprehension intact, naming intact, fund of knowledge appropriate; SLIGHTLY SLOWED SPEECH PATTERN. CRANIAL NERVE: no papilledema on fundoscopic exam, pupils equal and reactive to light, visual fields full to confrontation, extraocular muscles intact, no nystagmus, facial sensation symmetric, DECR RIGHT NL FOLD AND RIGHT LOWER FACIAL STRENGTH; hearing intact, palate elevates symmetrically, uvula midline, shoulder shrug symmetric, tongue midline MOTOR: normal bulk and tone, full strength in the LUE,  LLE; RUE 4+. RLE 4+ PROX, 5 DISTAL SENSORY: normal and symmetric to light touch COORDINATION: finger-nose-finger, fine finger movements normal REFLEXES: deep tendon reflexes present and symmetric GAIT/STATION: WIDE BASED GAIT     DIAGNOSTIC DATA (LABS, IMAGING, TESTING) - I reviewed patient records, labs, notes, testing and imaging myself where available.  No results found for: WBC, HGB, HCT, MCV, PLT No results found for: NA, K, CL, CO2, GLUCOSE, BUN, CREATININE, CALCIUM, PROT, ALBUMIN, AST, ALT, ALKPHOS, BILITOT, GFRNONAA, GFRAA No results found for: CHOL, HDL, LDLCALC, LDLDIRECT, TRIG, CHOLHDL No results found for: VOHY0V No results found for: VITAMINB12 No results found for: TSH    ASSESSMENT AND PLAN  65 y.o. year old female here with history of stroke in  2014, likely left MCA distribution. Now with post stroke pain in right leg and mild cognitive difficulties. Also with migraine type headaches.  Dx:   1. Cerebrovascular accident (CVA) due to embolism of left middle cerebral artery (HCC)   2. Right leg pain   3. BMI 50.0-59.9, adult (HCC)    Meds tried: topiramate. tylenol   PLAN:  STROKE PREVENTION - continue aspirin, statin, BP control for secondary stroke prevention - nutrition and exercise strategies reviewed (protein intake, plants) - refer to PT evaluations (leg pain, knee pain)  MIGRAINE WITH AURA + TENSION HEADACHES - restart topiramate 50mg  twice a day - ubrelvy 100mg  as needed (cannot use triptans due to stroke)  FATIGUE / OBESITY - refer to sleep study - refer to weight mgmt clinic  Meds ordered this encounter  Medications   topiramate (TOPAMAX) 50 MG tablet    Sig: Take 1 tablet (50 mg total) by mouth 2 (two) times daily.    Dispense:  60 tablet    Refill:  12   Ubrogepant (UBRELVY) 100 MG TABS    Sig: Take 100 mg by mouth as needed (for migraine; max 200mg  per 24 hours).    Dispense:  8 tablet    Refill:  12   Orders Placed This Encounter   Procedures   Ambulatory referral to Physical Therapy   Ambulatory referral to Sleep Studies   Ambulatory referral to [facility]   Return in about 6 months (around 09/27/2021).  I spent 60 minutes of face-to-face and non-face-to-face time with patient.  This included previsit chart review, lab review, order entry, electronic health record documentation, patient education.    , MD 03/30/2021, 8:46 AM Certified in Neurology, Neurophysiology and Neuroimaging  [facility] [address], [address] [place], [place] [address] [phone]

## 2021-04-01 ENCOUNTER — Telehealth: Payer: Self-pay

## 2021-04-01 NOTE — Telephone Encounter (Signed)
PA for [name] has been sent through cover my meds.  (Key: [id number])  [employer] has received your information, and the request will be reviewed. You may close this dialog, return to your dashboard, and perform other tasks.  You will receive an electronic determination in CoverMyMeds. You can see the latest determination by locating this request on your dashboard or by reopening this request. You will also receive a faxed copy of the determination. If you have any questions please contact [employer] at [phone].  If you need assistance, please chat with CoverMyMeds or call us at [phone].

## 2021-04-01 NOTE — Telephone Encounter (Signed)
Ubrelvy approved through 04/01/2022. Faxed approval letter to pharmacy.

## 2021-04-06 NOTE — Telephone Encounter (Signed)
[facility] called to let us know patient's insurance is out of network. Referral sent to [facility] outpatient PT instead. Phone: [phone].

## 2021-04-08 NOTE — Telephone Encounter (Signed)
Contacted [employer] back, spoke to [place] to verify quantity.  Rx was written to dispense 8 tab for 30 day supply. Clydie Braun stated she will withdraw PA as PA is not needed for the quantity.

## 2021-04-08 NOTE — Telephone Encounter (Signed)
Chelsea with [employer] called asking for the quantity limit for 30 days. Call back number is [phone] option 3, ref key#: [id number].

## 2021-04-09 NOTE — Telephone Encounter (Signed)
Received fax from [employer], stated QL needed. Called [employer] spoke with Kelsey Mendez to advise her that we have talked to [employer] twice already on this matter. #8 Tab for 30 days is not a quantity limit exception. She discussed with manager and stated we should not get ay further fax on this matter.

## 2021-06-10 ENCOUNTER — Telehealth: Payer: Self-pay | Admitting: *Deleted

## 2021-06-10 NOTE — Telephone Encounter (Signed)
[name] PA, key [id number], G43.1.  Patient not eligible (does not have coverage with the PBM/payer). Called patient LVM advising her I need new insurance information for [place] PA. Asked her to call or send via my chart.

## 2021-06-15 ENCOUNTER — Institutional Professional Consult (permissible substitution): Payer: PPO | Admitting: Neurology

## 2021-06-16 ENCOUNTER — Encounter: Payer: Self-pay | Admitting: Neurology

## 2021-07-01 ENCOUNTER — Encounter: Payer: Self-pay | Admitting: *Deleted

## 2021-07-01 NOTE — Telephone Encounter (Signed)
Have not heard from patient. [facility] spoke [facility] who stated she doesn't have new insurance information. [facility]hte[facility] advantage in Nov 2022. They have not refilled any meds this year. Sent her my chart.

## 2021-07-28 ENCOUNTER — Other Ambulatory Visit (HOSPITAL_COMMUNITY): Payer: Self-pay | Admitting: Family Medicine

## 2021-07-28 ENCOUNTER — Other Ambulatory Visit: Payer: Self-pay

## 2021-07-28 ENCOUNTER — Other Ambulatory Visit: Payer: Self-pay | Admitting: Orthopedic Surgery

## 2021-07-28 ENCOUNTER — Other Ambulatory Visit: Payer: Self-pay | Admitting: Family Medicine

## 2021-07-28 ENCOUNTER — Ambulatory Visit (HOSPITAL_COMMUNITY)
Admission: RE | Admit: 2021-07-28 | Discharge: 2021-07-28 | Disposition: A | Discharge status: Home / Self Care | Payer: PPO | Source: Ambulatory Visit | Attending: Family Medicine | Admitting: Family Medicine

## 2021-07-28 DIAGNOSIS — R0602 Shortness of breath: Secondary | ICD-10-CM

## 2021-07-28 DIAGNOSIS — R609 Edema, unspecified: Secondary | ICD-10-CM | POA: Insufficient documentation

## 2021-09-28 ENCOUNTER — Ambulatory Visit: Payer: PPO | Admitting: Diagnostic Neuroimaging

## 2021-09-29 ENCOUNTER — Encounter: Payer: Self-pay | Admitting: Diagnostic Neuroimaging

## 2021-10-21 ENCOUNTER — Ambulatory Visit: Payer: PPO | Admitting: Dietician

## 2021-11-18 ENCOUNTER — Ambulatory Visit: Payer: PPO | Admitting: Registered"

## 2021-12-24 ENCOUNTER — Encounter: Payer: PPO | Attending: Family Medicine | Admitting: Dietician

## 2021-12-24 ENCOUNTER — Encounter: Payer: Self-pay | Admitting: Dietician

## 2021-12-24 DIAGNOSIS — E119 Type 2 diabetes mellitus without complications: Secondary | ICD-10-CM | POA: Insufficient documentation

## 2021-12-24 NOTE — Progress Notes (Signed)
Diabetes Self-Management Education  Visit Type: First/Initial  Appt. Start Time: 1535 Appt. End Time: 1705  12/24/2021  [name], identified by name and date of birth, is a 66 y.o. female with a diagnosis of Diabetes: Type 2.   ASSESSMENT Patient is here today alone.  She would like to know how many carbs to eat in a day and how much sugar she is allowed.  Referral Diagnosis:  Type 2 Diabetes, HTN, HLD, vitamin D deficiency.  History includes:  Type 2 Diabetes, HTN, HLD, strokes, vitamin D deficiency, osteoporosis, migraines most days. A1C 6.9% 10/23/2021 Medications - Vitamin D, 2000 units daily  Patient's son lives with her as he feels that he needs to look out for her.  She does the shopping and cooking. She is on disability.  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 She has a difficulty with exercise due to weakness due to stroke.  This makes exercise more difficult.  She also has cognitive issues. She no longer does the PT exercises that she was given. "Always been a skimpy eater"  "Always been big." Downfall has been sweets. She states that she does not use the stove much as she is afraid of leaving it on. Occasionally gets a salad from [facility]. She does not check her blood sugar and is very afraid of needles.  Height 5' 6.5" (1.689 m), weight (!) 342 lb (155.1 kg). Body mass index is 54.37 kg/m.   Diabetes Self-Management Education - 12/24/21 1555       Visit Information   Visit Type First/Initial      Initial Visit   Diabetes Type Type 2    Date Diagnosed 2021    Are you currently following a meal plan? No    Are you taking your medications as prescribed? Not on Medications      Health Coping   How would you rate your overall health? Fair      Psychosocial Assessment   Patient Belief/Attitude about Diabetes Afraid    What is the hardest part about your diabetes right now, causing you the most concern, or is the most  worrisome to you about your diabetes?   Being active    Self-care barriers None    Self-management support Doctor's office    Other persons present Patient    Patient Concerns Nutrition/Meal planning    Special Needs Simplified materials    Preferred Learning Style No preference indicated    Learning Readiness Ready    How often do you need to have someone help you when you read instructions, pamphlets, or other written materials from your doctor or pharmacy? 3 - Sometimes    What is the last grade level you completed in school? BS      Pre-Education Assessment   Patient understands the diabetes disease and treatment process. Needs Instruction    Patient understands incorporating nutritional management into lifestyle. Needs Instruction    Patient undertands incorporating physical activity into lifestyle. Needs Instruction    Patient understands using medications safely. Needs Instruction    Patient understands monitoring blood glucose, interpreting and using results Needs Instruction    Patient understands prevention, detection, and treatment of acute complications. Needs Instruction    Patient understands prevention, detection, and treatment of chronic complications. Needs Instruction    Patient understands how to develop strategies to address psychosocial issues. Needs Instruction    Patient understands how to develop strategies to promote health/change  behavior. Needs Instruction      Complications   Last HgB A1C per patient/outside source 6.9 %   10/2021 per patient   How often do you check your blood sugar? 0 times/day (not testing)    Have you had a dilated eye exam in the past 12 months? Yes    Have you had a dental exam in the past 12 months? No    Are you checking your feet? No      Dietary Intake   Breakfast 3 eggs, 2 slices honey wheat toast   12:30   Snack (afternoon) cherries, tomatoes, or watermelon    Dinner chicken (crockpot), salad with eggs, cake OR rare fried  chicken, salad   6   Beverage(s) water, coffee with creamer and stevia, hot or cold tea with stevia      Activity / Exercise   Activity / Exercise Type Light (walking / raking leaves)    How many days per week do you exercise? 1    How many minutes per day do you exercise? 10    Total minutes per week of exercise 10      Patient Education   Previous Diabetes Education No    Disease Pathophysiology Definition of diabetes, type 1 and 2, and the diagnosis of diabetes    Healthy Eating Role of diet in the treatment of diabetes and the relationship between the three main macronutrients and blood glucose level;Food label reading, portion sizes and measuring food.;Meal options for control of blood glucose level and chronic complications.    Being Active Role of exercise on diabetes management, blood pressure control and cardiac health.;Helped patient identify appropriate exercises in relation to his/her diabetes, diabetes complications and other health issue.    Medications Other (comment)   brief discussion about Metformin   Monitoring Identified appropriate SMBG and/or A1C goals.;Daily foot exams;Yearly dilated eye exam    Acute complications Taught prevention, symptoms, and  treatment of hypoglycemia - the 15 rule.;Discussed and identified patients' prevention, symptoms, and treatment of hyperglycemia.    Chronic complications Assessed and discussed foot care and prevention of foot problems;Retinopathy and reason for yearly dilated eye exams;Relationship between chronic complications and blood glucose control    Diabetes Stress and Support Identified and addressed patients feelings and concerns about diabetes;Worked with patient to identify barriers to care and solutions;Role of stress on diabetes      Individualized Goals (developed by patient)   Nutrition General guidelines for healthy choices and portions discussed    Physical Activity Exercise 3-5 times per week;15 minutes per day   start  slowly and increase as able   Medications Not Applicable    Monitoring  Not Applicable    Problem Solving Eating Pattern;Addressing barriers to behavior change    Reducing Risk treat hypoglycemia with 15 grams of carbs if blood glucose less than 70mg /dL;do foot checks daily      Post-Education Assessment   Patient understands the diabetes disease and treatment process. Comprehends key points    Patient understands incorporating nutritional management into lifestyle. Needs Review    Patient undertands incorporating physical activity into lifestyle. Needs Review    Patient understands using medications safely. Comphrehends key points    Patient understands monitoring blood glucose, interpreting and using results Comprehends key points    Patient understands prevention, detection, and treatment of acute complications. Comprehends key points    Patient understands prevention, detection, and treatment of chronic complications. Comprehends key points    Patient understands how  to develop strategies to address psychosocial issues. Comprehends key points    Patient understands how to develop strategies to promote health/change behavior. Needs Review      Outcomes   Expected Outcomes Demonstrated interest in learning. Expect positive outcomes    Future DMSE 2 months    Program Status Not Completed             Individualized Plan for Diabetes Self-Management Training:   Learning Objective:  Patient will have a greater understanding of diabetes self-management. Patient education plan is to attend individual and/or group sessions per assessed needs and concerns.   Plan:   Patient Instructions  Consider further Physical Therapy and/or doing the Physical Therapy exercises that you have done more frequently.  Georgann Housekeeper you tube videos  Sit and Be fit you tube  Check your feet daily.  Use a mirror to see the bottom of your feet if you are able.  Half your plate should be non starchy  vegetables 1 quarter of the plate should be a protein (chicken, other meat or fish, eggs, cheese, nuts, seeds, yogurt, beans) 1 quarter should include a starch.  Choose whole grains more than refined grains. Limit refined sugars as they spike your blood sugar quickly.  Resources: PBwithJ.ca recipes (plant based and simple desserts that are low in sugar made with whole foods) Sweet peoples club for self led diabetes modules (free)      Expected Outcomes:  Demonstrated interest in learning. Expect positive outcomes  Education material provided: ADA - How to Thrive: A Guide for Your Journey with Diabetes, Food label handouts, Meal plan card, Snack sheet, and Diabetes Resources  If problems or questions, patient to contact team via:  Phone  Future DSME appointment: 2 months

## 2021-12-24 NOTE — Patient Instructions (Addendum)
Consider further Physical Therapy and/or doing the Physical Therapy exercises that you have done more frequently.  Georgann Housekeeper you tube videos  Sit and Be fit you tube  Check your feet daily.  Use a mirror to see the bottom of your feet if you are able.  Half your plate should be non starchy vegetables 1 quarter of the plate should be a protein (chicken, other meat or fish, eggs, cheese, nuts, seeds, yogurt, beans) 1 quarter should include a starch.  Choose whole grains more than refined grains. Limit refined sugars as they spike your blood sugar quickly.  Resources: PBwithJ.ca recipes (plant based and simple desserts that are low in sugar made with whole foods) Sweet peoples club for self led diabetes modules (free)

## 2022-01-18 ENCOUNTER — Ambulatory Visit: Payer: PPO | Admitting: Skilled Nursing Facility1

## 2022-02-04 ENCOUNTER — Ambulatory Visit: Payer: PPO | Admitting: Registered"

## 2022-02-12 ENCOUNTER — Encounter: Payer: Self-pay | Admitting: *Deleted

## 2022-02-12 ENCOUNTER — Encounter: Payer: Self-pay | Admitting: Internal Medicine

## 2022-02-12 ENCOUNTER — Ambulatory Visit: Payer: PPO | Attending: Internal Medicine | Admitting: Internal Medicine

## 2022-02-12 VITALS — BP 136/84 | HR 69 | Ht 66.0 in | Wt 333.0 lb

## 2022-02-12 DIAGNOSIS — R0602 Shortness of breath: Secondary | ICD-10-CM | POA: Diagnosis not present

## 2022-02-12 NOTE — Progress Notes (Addendum)
Cardiology Office Note   Date:  02/14/2022   ID:  Kelsey Mendez, DOB Jun 13, 1955, MRN [id number]  PCP:  Gareth Morgan, MD  Cardiologist:   Dietrich Pates, MD    Pt referred for CP    History of Present Illness: Kelsey Mendez is a 66 y.o. female with a history of HTN, HL, HTN, CVA (treated in [place] in 2014;  R MCA embolism R arm and leg weakness).  Pt moved to [place] in 2015  Has been seen by [place] neuro,      The pt is followed by Jilda Panda this summer had increased swwelling in legs   When weather hot   She also complained of some SOB with activity     And, she had a couple episodes of sharp chest pain that occurred at rest.  Currently she denies CP   She says her ankles are better     No papitiaions    Last Hgb A1 C 6.9            Current Meds  Medication Sig   acetaminophen-codeine (TYLENOL #3) 300-30 MG tablet Take 1-2 tablets by mouth every 6 (six) hours as needed.   amLODipine (NORVASC) 10 MG tablet Take 10 mg by mouth daily.   aspirin 325 MG tablet Take 325 mg by mouth daily.   atorvastatin (LIPITOR) 40 MG tablet Take 40 mg by mouth daily.   cholecalciferol (VITAMIN D3) 25 MCG (1000 UNIT) tablet Take 5,000 Units by mouth daily.   cloNIDine (CATAPRES) 0.2 MG tablet Take 0.2 mg by mouth 2 (two) times daily.   metoprolol succinate (TOPROL-XL) 50 MG 24 hr tablet Take 50 mg by mouth daily. Take with or immediately following a meal.   topiramate (TOPAMAX) 50 MG tablet Take 1 tablet (50 mg total) by mouth 2 (two) times daily.     Allergies:   Chlorthalidone and Hydrochlorothiazide   Past Medical History:  Diagnosis Date   Anxiety    Diabetes mellitus without complication (HCC)    Hemiplegia (HCC)    Hyperlipidemia    Hypertension    Osteoporosis    per patient   Stroke (HCC)    Vitamin D deficiency     Past Surgical History:  Procedure Laterality Date   CESAREAN SECTION     HERNIA REPAIR     TONSILLECTOMY       Social History:  The  patient  reports that she quit smoking about 9 years ago. Her smoking use included cigarettes. She has a 2.25 pack-year smoking history. She has never used smokeless tobacco. She reports that she does not drink alcohol and does not use drugs.   Family History:  The patient's family history includes Cancer in her father and father; Congestive Heart Failure in her sister; Diabetes in her mother; Glaucoma in her father; Hypertension in her father and mother; Sarcoidosis in her sister.    ROS:  Please see the history of present illness. All other systems are reviewed and  Negative to the above problem except as noted.    PHYSICAL EXAM: VS:  BP 136/84   Pulse 69   Ht 5\' 6"  (1.676 m)   Wt (!) 333 lb (151 kg)   SpO2 97%   BMI 53.75 kg/m   GEN: Morbidly obese 66 yo  in no acute distress  HEENT: normal  Neck: no JVD, carotid bruits Cardiac: RRR; no murmurs  No LE  edema  Respiratory:  clear to auscultation  bilaterally GI: soft, nontender, nondistended, + BS  No hepatomegaly  MS: no deformity Moving all extremities   Skin: warm and dry, no rash Neuro:  Strength and sensation are intact Psych: euthymic mood, full affect   EKG:  EKG is ordered today.  SR   62 bpm     Lipid Panel No results found for: "CHOL", "TRIG", "HDL", "CHOLHDL", "VLDL", "LDLCALC", "LDLDIRECT"    Wt Readings from Last 3 Encounters:  02/12/22 (!) 333 lb (151 kg)  12/24/21 (!) 342 lb (155.1 kg)  03/30/21 (!) 339 lb 8 oz (154 kg)      ASSESSMENT AND PLAN:  1  Chest pain   Atypical   I do not think it is cardiac in origne  2  SOB with exertion.   Volume status appears OK on exam, though body habitus makes ift difficult    Will set up for an echo  3   HL  Need to get recors   Pt on lipiotor  4  Hx CVA   Keep on ASA and lipitor    Will get records from [place]  She also had a cardiologist there       5  DM   Pt's last A1C was 6.9   Reviewed diet    Refer back for further control      Follow up in  cardiology will be based on echo results     Current medicines are reviewed at length with the patient today.  The patient does not have concerns regarding medicines.  Signed, Dietrich Pates, MD  02/14/2022 9:34 PM    [facility] [address] [place], [place], [place]  [address] Phone: [phone]; Fax: [phone]

## 2022-02-12 NOTE — Patient Instructions (Signed)
Medication Instructions:  Your physician recommends that you continue on your current medications as directed. Please refer to the Current Medication list given to you today.   Labwork:  None today  Testing/Procedures: Your physician has requested that you have an echocardiogram. Echocardiography is a painless test that uses sound waves to create images of your heart. It provides your doctor with information about the size and shape of your heart and how well your heart's chambers and valves are working. This procedure takes approximately one hour. There are no restrictions for this procedure.   Follow-Up: To be determined  Any Other Special Instructions Will Be Listed Below (If Applicable).  If you need a refill on your cardiac medications before your next appointment, please call your pharmacy.

## 2022-02-15 ENCOUNTER — Other Ambulatory Visit: Payer: Self-pay

## 2022-02-15 ENCOUNTER — Emergency Department (HOSPITAL_COMMUNITY): Payer: PPO

## 2022-02-15 ENCOUNTER — Encounter (HOSPITAL_COMMUNITY): Payer: Self-pay | Admitting: Emergency Medicine

## 2022-02-15 DIAGNOSIS — R251 Tremor, unspecified: Secondary | ICD-10-CM | POA: Insufficient documentation

## 2022-02-15 DIAGNOSIS — I10 Essential (primary) hypertension: Secondary | ICD-10-CM | POA: Insufficient documentation

## 2022-02-15 DIAGNOSIS — I959 Hypotension, unspecified: Secondary | ICD-10-CM | POA: Diagnosis not present

## 2022-02-15 DIAGNOSIS — E119 Type 2 diabetes mellitus without complications: Secondary | ICD-10-CM | POA: Insufficient documentation

## 2022-02-15 DIAGNOSIS — Z87891 Personal history of nicotine dependence: Secondary | ICD-10-CM | POA: Insufficient documentation

## 2022-02-15 DIAGNOSIS — R531 Weakness: Secondary | ICD-10-CM | POA: Diagnosis present

## 2022-02-15 LAB — CBC WITH DIFFERENTIAL/PLATELET
Abs Immature Granulocytes: 0.01 10*3/uL (ref 0.00–0.07)
Basophils Absolute: 0 10*3/uL (ref 0.0–0.1)
Basophils Relative: 1 %
Eosinophils Absolute: 0.1 10*3/uL (ref 0.0–0.5)
Eosinophils Relative: 1 %
HCT: 45.9 % (ref 36.0–46.0)
Hemoglobin: 14.1 g/dL (ref 12.0–15.0)
Immature Granulocytes: 0 %
Lymphocytes Relative: 14 %
Lymphs Abs: 0.8 10*3/uL (ref 0.7–4.0)
MCH: 25.7 pg — ABNORMAL LOW (ref 26.0–34.0)
MCHC: 30.7 g/dL (ref 30.0–36.0)
MCV: 83.8 fL (ref 80.0–100.0)
Monocytes Absolute: 0.6 10*3/uL (ref 0.1–1.0)
Monocytes Relative: 10 %
Neutro Abs: 4.1 10*3/uL (ref 1.7–7.7)
Neutrophils Relative %: 74 %
Platelets: 320 10*3/uL (ref 150–400)
RBC: 5.48 MIL/uL — ABNORMAL HIGH (ref 3.87–5.11)
RDW: 15.9 % — ABNORMAL HIGH (ref 11.5–15.5)
WBC: 5.6 10*3/uL (ref 4.0–10.5)
nRBC: 0 % (ref 0.0–0.2)

## 2022-02-15 LAB — COMPREHENSIVE METABOLIC PANEL
ALT: 20 U/L (ref 0–44)
AST: 22 U/L (ref 15–41)
Albumin: 3.9 g/dL (ref 3.5–5.0)
Alkaline Phosphatase: 91 U/L (ref 38–126)
Anion gap: 10 (ref 5–15)
BUN: 14 mg/dL (ref 8–23)
CO2: 26 mmol/L (ref 22–32)
Calcium: 9.3 mg/dL (ref 8.9–10.3)
Chloride: 104 mmol/L (ref 98–111)
Creatinine, Ser: 0.99 mg/dL (ref 0.44–1.00)
GFR, Estimated: >60 mL/min (ref >60)
Glucose, Bld: 151 mg/dL — ABNORMAL HIGH (ref 70–99)
Potassium: 4 mmol/L (ref 3.5–5.1)
Sodium: 140 mmol/L (ref 135–145)
Total Bilirubin: 1 mg/dL (ref 0.3–1.2)
Total Protein: 7.4 g/dL (ref 6.5–8.1)

## 2022-02-15 LAB — CBG MONITORING, ED: Glucose-Capillary: 123 mg/dL — ABNORMAL HIGH (ref 70–99)

## 2022-02-15 NOTE — ED Triage Notes (Addendum)
Pt brought in by son after pt c/o weakness. Pt has had 2 strokes in past and son states speech is better some days than others and today it seem like she is having more difficulty with her words. 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 LKW yesteday evening. Pt stated she checked her BP at home earlier today and it was 62'X systolic.

## 2022-02-16 ENCOUNTER — Emergency Department (HOSPITAL_COMMUNITY)
Admission: EM | Admit: 2022-02-16 | Discharge: 2022-02-16 | Disposition: A | Discharge status: Home / Self Care | Payer: PPO | Attending: Emergency Medicine | Admitting: Emergency Medicine

## 2022-02-16 DIAGNOSIS — R531 Weakness: Secondary | ICD-10-CM

## 2022-02-16 DIAGNOSIS — R5381 Other malaise: Secondary | ICD-10-CM

## 2022-02-16 LAB — URINALYSIS, ROUTINE W REFLEX MICROSCOPIC
Bilirubin Urine: NEGATIVE
Glucose, UA: NEGATIVE mg/dL
Ketones, ur: NEGATIVE mg/dL
Leukocytes,Ua: NEGATIVE
Nitrite: NEGATIVE
Protein, ur: NEGATIVE mg/dL
Specific Gravity, Urine: 1.005 (ref 1.005–1.030)
pH: 6 (ref 5.0–8.0)

## 2022-02-16 NOTE — ED Provider Notes (Signed)
[facility] Emergency Department Provider Note MRN:  [id number]  Arrival date & time: 02/16/22     Chief Complaint   Weakness   History of Present Illness   Kelsey Mendez is a 66 y.o. year-old female with a history of hypertension, stroke, diabetes presenting to the ED with chief complaint of weakness.  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  Her mother sustained a hip fracture.  Patient's son thinks maybe that the patient overdid it and is now exhausted.  Patient has a history of stroke and son noticed some slurred speech earlier today.  He explains that she has good days and bad days with her speech and so this is not uncommon.  Currently speech is back to baseline.  Patient also noted some low blood pressures at home, lightheadedness, some tremulousness, malaise.  Denies fever, no burning with urination, no chest pain or shortness of breath, no abdominal pain, no new numbness or weakness to the arms or legs.  Review of Systems  A thorough review of systems was obtained and all systems are negative except as noted in the HPI and PMH.   Patient's Health History    Past Medical History:  Diagnosis Date   Anxiety    Diabetes mellitus without complication ([place])    Hemiplegia ([place])    Hyperlipidemia    Hypertension    Osteoporosis    per patient   Stroke ([place])    Vitamin D deficiency     Past Surgical History:  Procedure Laterality Date   CESAREAN SECTION     HERNIA REPAIR     TONSILLECTOMY      Family History  Problem Relation Age of Onset   Hypertension Mother    Diabetes Mother    Cancer Father    Hypertension Father    Glaucoma Father    Congestive Heart Failure Sister    Sarcoidosis Sister    Cancer Father        stage 4     Social History   Socioeconomic History   Marital status: Single    Spouse name: Not on file   Number of children: 1   Years of education: BS   Highest education level: Not  on file  Occupational History    Employer: OTHER    Comment: disabled  Tobacco Use   Smoking status: Former    Packs/day: 0.25    Years: 9.00    Total pack years: 2.25    Types: Cigarettes    Quit date: 01/16/2013    Years since quitting: 9.0   Smokeless tobacco: Never  Vaping Use   Vaping Use: Never used  Substance and Sexual Activity   Alcohol use: No    Alcohol/week: 0.0 standard drinks of alcohol   Drug use: No   Sexual activity: Not on file  Other Topics Concern   Not on file  Social History Narrative   Patient lives at home alone.   Caffeine Use: 2-3 cups daily   Right handed   Social Determinants of Health   Financial Resource Strain: Not on file  Food Insecurity: Not on file  Transportation Needs: Not on file  Physical Activity: Not on file  Stress: Not on file  Social Connections: Not on file  Intimate Partner Violence: Not on file     Physical Exam   Vitals:   02/15/22 2146 02/16/22 0218  BP: (!) 148/81 138/78  Pulse: 80 62  Resp: (!) 22 20  Temp: 98.5 F (36.9 C)   SpO2: 95% 100%    CONSTITUTIONAL: Well-appearing, NAD NEURO/PSYCH:  Alert and oriented x 3, mild right leg weakness, otherwise normal and symmetric strength and sensation, normal coordination, normal speech EYES:  eyes equal and reactive ENT/NECK:  no LAD, no JVD CARDIO: Regular rate, well-perfused, normal S1 and S2 PULM:  CTAB no wheezing or rhonchi GI/GU:  non-distended, non-tender MSK/SPINE:  No gross deformities, no edema SKIN:  no rash, atraumatic   *Additional and/or pertinent findings included in MDM below  Diagnostic and Interventional Summary    EKG Interpretation  Date/Time:  Monday February 15 2022 21:52:50 EDT Ventricular Rate:  74 PR Interval:  130 QRS Duration: 86 QT Interval:  362 QTC Calculation: 401 R Axis:   -27 Text Interpretation: Normal sinus rhythm Normal ECG No previous ECGs available Confirmed by Kennis Carina [id number]) on 02/15/2022 11:38:58 PM        Labs Reviewed  CBC WITH DIFFERENTIAL/PLATELET - Abnormal; Notable for the following components:      Result Value   RBC 5.48 (*)    MCH 25.7 (*)    RDW 15.9 (*)    All other components within normal limits  COMPREHENSIVE METABOLIC PANEL - Abnormal; Notable for the following components:   Glucose, Bld 151 (*)    All other components within normal limits  CBG MONITORING, ED - Abnormal; Notable for the following components:   Glucose-Capillary 123 (*)    All other components within normal limits  URINALYSIS, ROUTINE W REFLEX MICROSCOPIC  CBG MONITORING, ED    CT HEAD WO CONTRAST ( )  Final Result      Medications - No data to display   Procedures  /  Critical Care Procedures  ED Course and Medical Decision Making  Initial Impression and Ddx Suspect overexertion/exhaustion with a component of dehydration.  Stroke, TIA considered but the neurological exam at this time is quite reassuring, she is at her baseline per son who is at bedside.  She does exhibit slurred speech fairly often and so today is not out of the ordinary.  Awaiting labs, CT, urinalysis.  Other considerations include electrolyte disturbance, anemia, AKI, UTI.  Past medical/surgical history that increases complexity of ED encounter: History of stroke  Interpretation of Diagnostics I personally reviewed the EKG and my interpretation is as follows: Sinus rhythm  Labs reassuring without significant blood count or electrolyte disturbance, urinalysis without significant evidence of infection  Patient Reassessment and Ultimate Disposition/Management     Appropriate for discharge.  Patient management required discussion with the following services or consulting groups:  None  Complexity of Problems Addressed Acute illness or injury that poses threat of life of bodily function  Additional Data Reviewed and Analyzed Further history obtained from: Further history from spouse/family member  Additional Factors  Impacting ED Encounter Risk None  Elmer Sow. Pilar Plate, MD [facility] [email or web address]  Final Clinical Impressions(s) / ED Diagnoses     ICD-10-CM   1. Weakness  R53.1     2. Malaise  R53.81       ED Discharge Orders     None        Discharge Instructions Discussed with and Provided to Patient:   Discharge Instructions   None      Sabas Sous, MD 02/16/22 [phone]

## 2022-02-16 NOTE — Discharge Instructions (Signed)
You were evaluated in the Emergency Department and after careful evaluation, we did not find any emergent condition requiring admission or further testing in the hospital.  Your exam/testing today was overall reassuring.  Urine sample did not show any signs of infection.  Recommend rest and trying not to overexert yourself over the next several days.  Please return to the Emergency Department if you experience any worsening of your condition.  Thank you for allowing [place] to be a part of your care.

## 2022-02-16 NOTE — ED Notes (Signed)
Asked pt about giving urine sample, pt already went to the restroom prior coming to the room

## 2022-02-17 ENCOUNTER — Ambulatory Visit (HOSPITAL_COMMUNITY)
Admission: RE | Admit: 2022-02-17 | Discharge: 2022-02-17 | Disposition: A | Discharge status: Home / Self Care | Payer: PPO | Source: Ambulatory Visit | Attending: Internal Medicine | Admitting: Internal Medicine

## 2022-02-17 DIAGNOSIS — R0602 Shortness of breath: Secondary | ICD-10-CM | POA: Insufficient documentation

## 2022-02-17 LAB — ECHOCARDIOGRAM COMPLETE
AR max vel: 2.52 cm2
AV Area VTI: 2.35 cm2
AV Area mean vel: 2.24 cm2
AV Mean grad: 5 mmHg
AV Peak grad: 10.8 mmHg
Ao pk vel: 1.64 m/s
Area-P 1/2: 3.54 cm2
MV VTI: 2.57 cm2
S' Lateral: 3 cm

## 2022-02-17 NOTE — Progress Notes (Signed)
*  PRELIMINARY RESULTS* Echocardiogram 2D Echocardiogram has been performed.  Kelsey Mendez 02/17/2022, 12:34 PM

## 2022-02-18 ENCOUNTER — Telehealth: Payer: Self-pay

## 2022-02-18 NOTE — Telephone Encounter (Signed)
Patient notified and verbalized understanding. Patient had no questions or concerns at this time. PCP copied 

## 2022-02-18 NOTE — Telephone Encounter (Signed)
-----   Message from Nuala Alpha, LPN sent at 11/17/9483  8:19 AM EDT ----- Linna Hoff pt  ----- Message ----- From: Fay Records, MD Sent: 02/17/2022  10:10 PM EDT To: Rebec[facility] Triage  Echo shows normal pumping and relaxing function of the heart  Normal valve function

## 2022-03-19 ENCOUNTER — Ambulatory Visit: Payer: PPO | Admitting: Dietician

## 2022-11-30 IMAGING — US US EXTREM LOW VENOUS*L*
1 series · 13 of 24 positions shown · non-contrast
Comparison: None.

CLINICAL DATA: Chronic left lower extremity edema.



[Series 1: us extrem low venous*left* · 0.08mm/px · 13 of 39 slices shown]
[im 1/39]
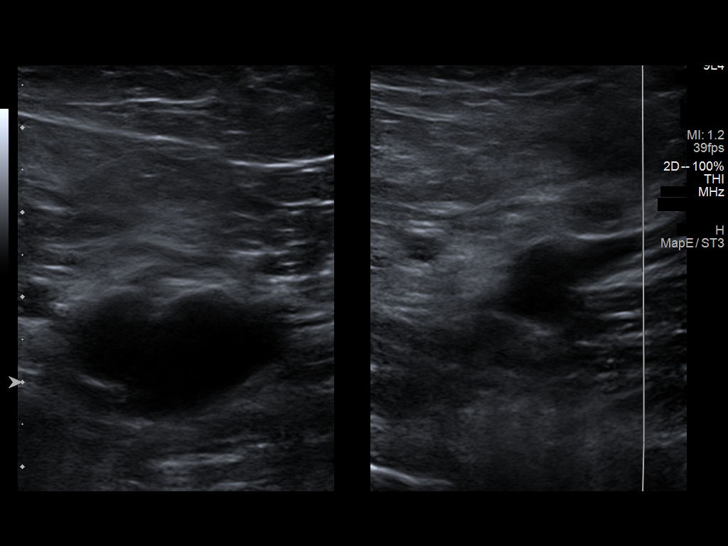
[im 4/39]
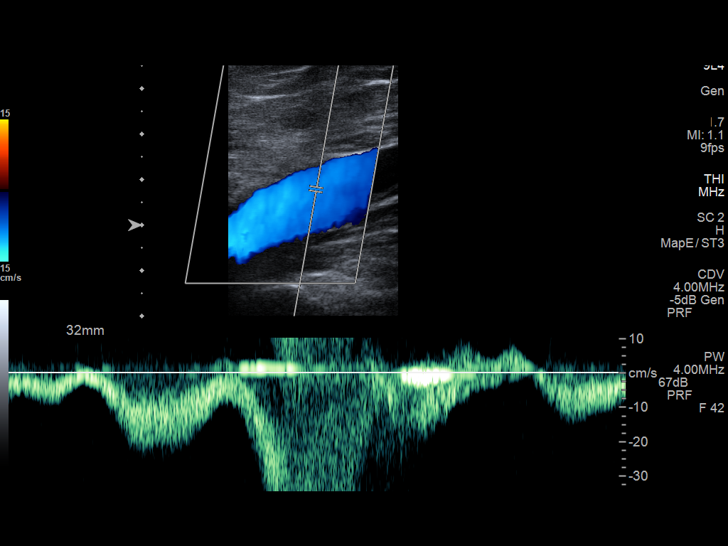
[im 7/39]
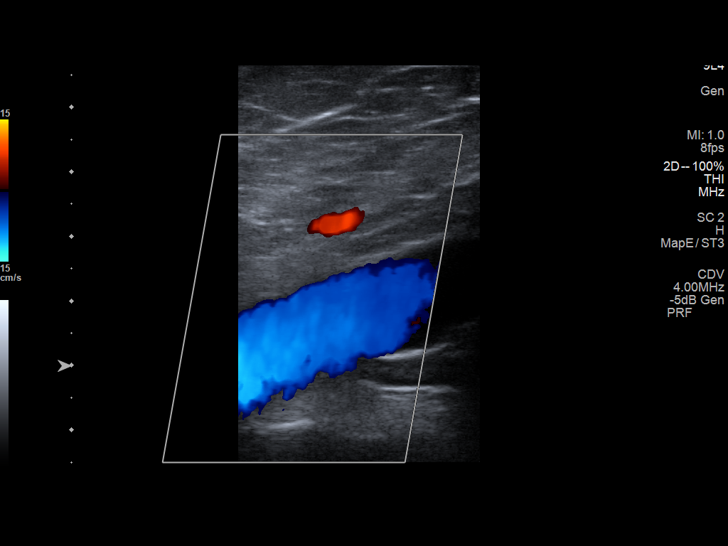
[im 10/39]
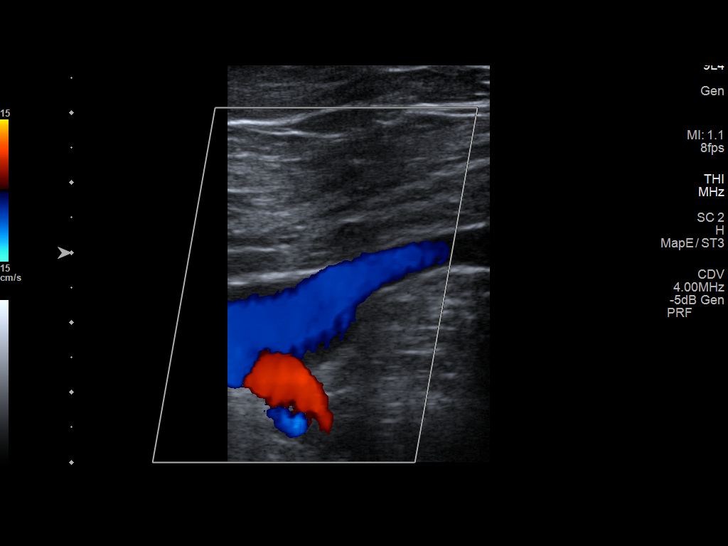
[im 14/39]
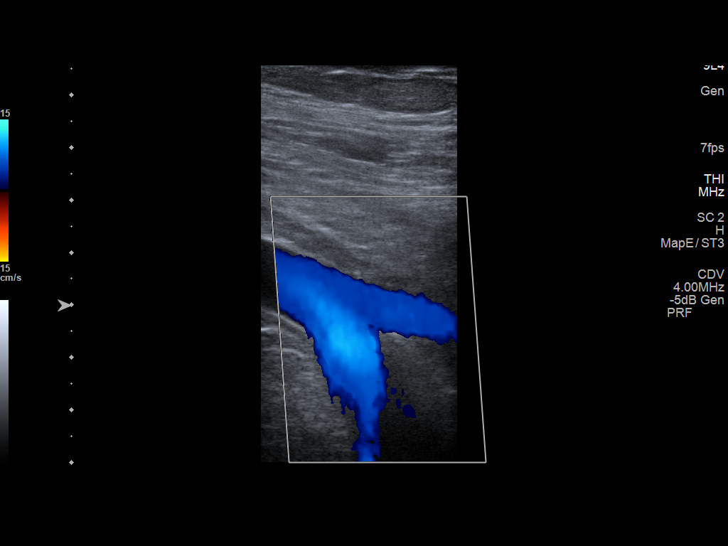
[im 17/39]
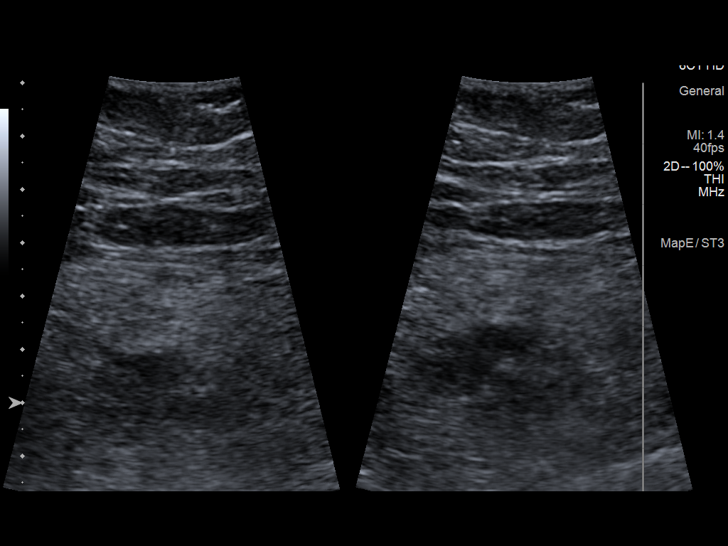
[im 20/39]
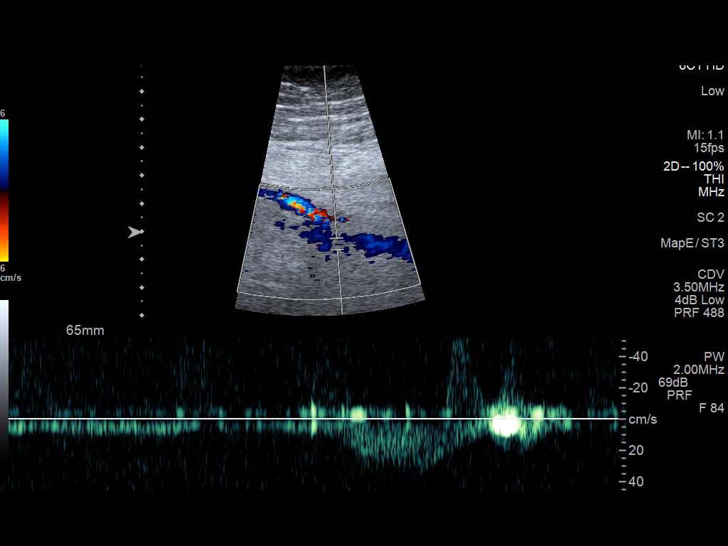
[im 22/39]
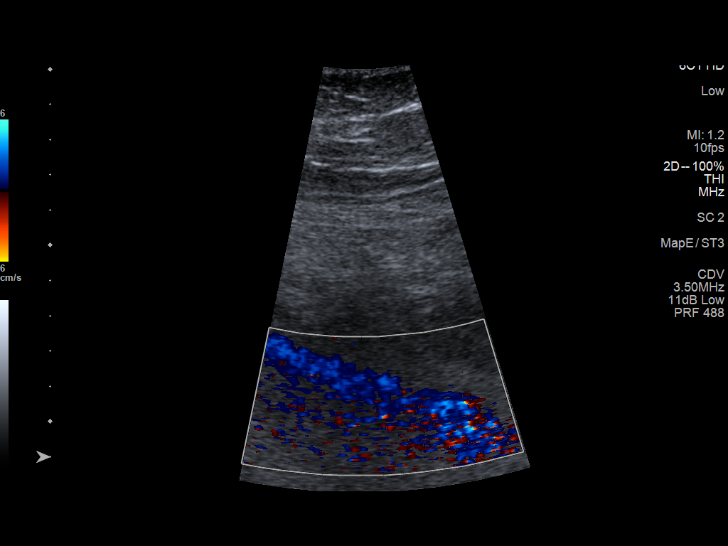
[im 25/39]
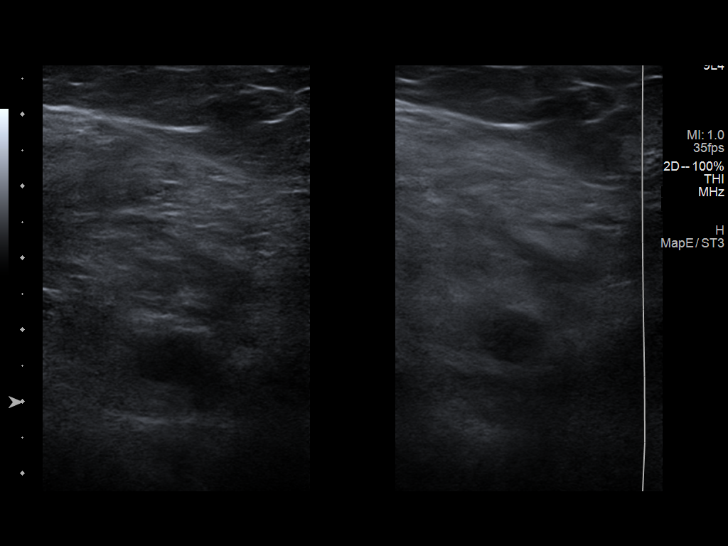
[im 29/39]
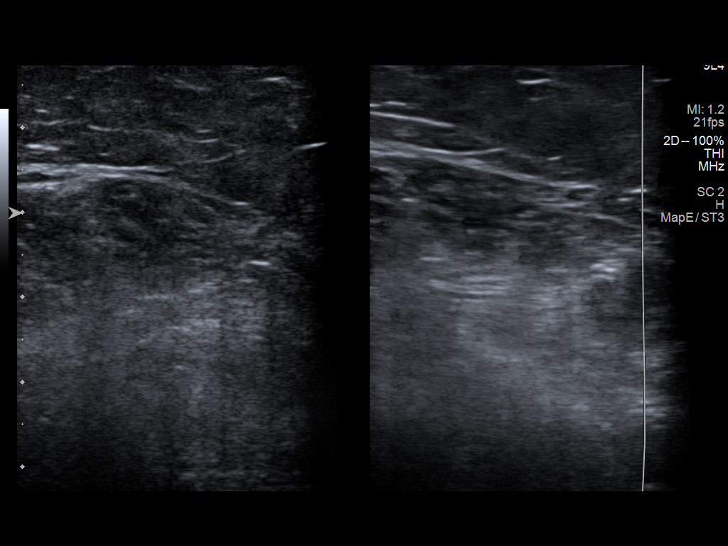
[im 32/39]
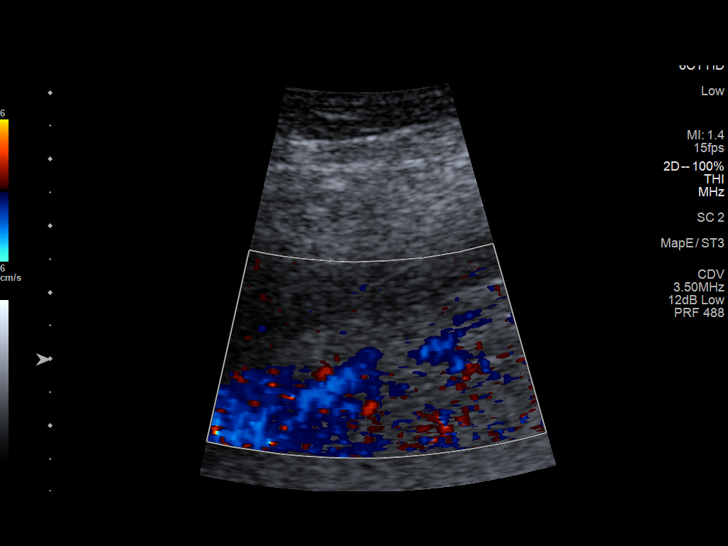
[im 35/39]
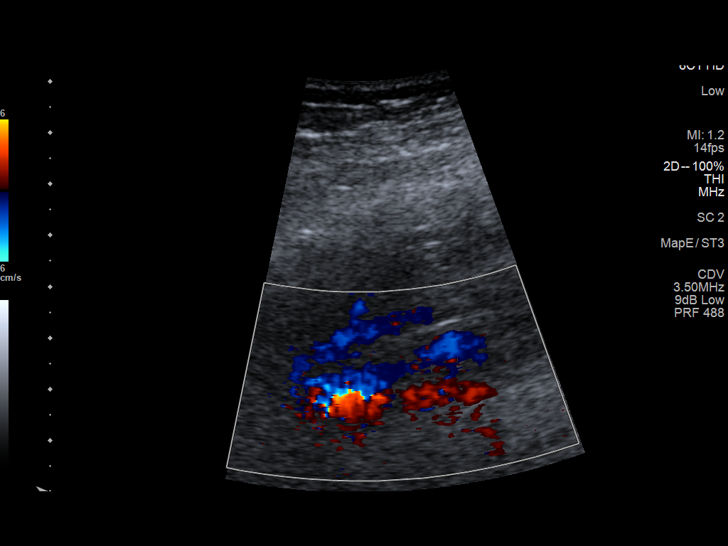
[im 39/39]
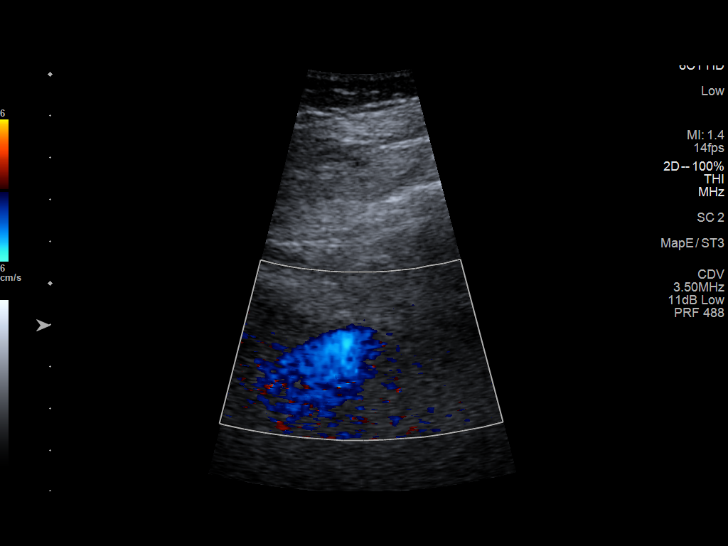

[13 of 24 positions shown; findings below may reference images not displayed]

FINDINGS: Contralateral Common Femoral Vein: Respiratory phasicity is normal
and symmetric with the symptomatic side. No evidence of thrombus.
Normal compressibility.

Common Femoral Vein: No evidence of thrombus. Normal
compressibility, respiratory phasicity and response to augmentation.

Saphenofemoral Junction: No evidence of thrombus. Normal
compressibility and flow on color Doppler imaging.

Profunda Femoral Vein: No evidence of thrombus. Normal
compressibility and flow on color Doppler imaging.

Femoral Vein: No evidence of thrombus. Normal compressibility,
respiratory phasicity and response to augmentation.

Popliteal Vein: No evidence of thrombus. Normal compressibility,
respiratory phasicity and response to augmentation.

Calf Veins: Visualized left deep calf veins are patent without
thrombus. Limited evaluation.

Other Findings:  None.
IMPRESSION: Negative for deep venous thrombosis in left lower extremity. Limited
evaluation of the deep calf veins due to body habitus.

## 2024-01-03 ENCOUNTER — Ambulatory Visit: Admitting: Podiatry

## 2024-01-11 ENCOUNTER — Encounter: Payer: Self-pay | Admitting: Podiatry

## 2024-01-11 ENCOUNTER — Ambulatory Visit: Admitting: Podiatry

## 2024-01-11 DIAGNOSIS — M79674 Pain in right toe(s): Secondary | ICD-10-CM

## 2024-01-11 DIAGNOSIS — B351 Tinea unguium: Secondary | ICD-10-CM

## 2024-01-11 DIAGNOSIS — Q828 Other specified congenital malformations of skin: Secondary | ICD-10-CM | POA: Diagnosis not present

## 2024-01-11 DIAGNOSIS — E1142 Type 2 diabetes mellitus with diabetic polyneuropathy: Secondary | ICD-10-CM | POA: Diagnosis not present

## 2024-01-11 DIAGNOSIS — M79675 Pain in left toe(s): Secondary | ICD-10-CM | POA: Diagnosis not present

## 2024-01-11 NOTE — Progress Notes (Signed)
  Subjective:  Patient ID: Kelsey Mendez, female    DOB: 1955-09-30,   MRN: [id number]  Chief Complaint  Patient presents with   Diabetes    I have something growing on the bottom of my left foot.  The big one is starting to grow.  I would like to have my toenails done too.  Saw Lauraine Cunning, NP -  11/17/2023; A1c - 6.1    68 y.o. female presents for concern of callus on her left foot as well as  concern of thickened elongated and painful nails that are difficult to trim. Requesting to have them trimmed today. Relates burning and tingling in their feet. Patient is diabetic and last A1c was No results found for: HGBA1C .   PCP:  Nemiah Kemps, MD    . Denies any other pedal complaints. Denies n/v/f/c.   Past Medical History:  Diagnosis Date   Anxiety    Diabetes mellitus without complication (HCC)    Hemiplegia (HCC)    Hyperlipidemia    Hypertension    Osteoporosis    per patient   Stroke (HCC)    Vitamin D deficiency     Objective:  Physical Exam: Vascular: DP/PT pulses 2/4 bilateral. CFT <3 seconds. Absent hair growth on digits. Edema noted to bilateral lower extremities. Xerosis noted bilaterally.  Skin. No lacerations or abrasions bilateral feet. Nails 1-5 bilateral  are thickened discolored and elongated with subungual debris. Hyperkeratotic lesions noted to left plantar first third and fifth metatarsal heads.  Musculoskeletal: MMT 5/5 bilateral lower extremities in DF, PF, Inversion and Eversion. Deceased ROM in DF of ankle joint.  Neurological: Sensation intact to light touch. Protective sensation diminished bilateral.    Assessment:   1. Pain due to onychomycosis of toenails of both feet   2. Porokeratosis   3. Type 2 diabetes mellitus with peripheral neuropathy (HCC)      Plan:  Patient was evaluated and treated and all questions answered. -Discussed and educated patient on diabetic foot care, especially with  regards to the vascular, neurological and  musculoskeletal systems.  -Stressed the importance of good glycemic control and the detriment of not  controlling glucose levels in relation to the foot. -Discussed supportive shoes at all times and checking feet regularly.  -Mechanically debrided all nails 1-5 bilateral using sterile nail nipper and filed with dremel without incident  -Hyperkeratotic lesions x 3 debrided to plantar left first third and fifth metatarsal heads with chisel without incident.  -Answered all patient questions -Patient to return  in 3 months for at risk foot care -Patient advised to call the office if any problems or questions arise in the meantime.   Asberry Failing, DPM

## 2024-02-10 DIAGNOSIS — E1169 Type 2 diabetes mellitus with other specified complication: Secondary | ICD-10-CM | POA: Insufficient documentation

## 2024-02-10 DIAGNOSIS — E1165 Type 2 diabetes mellitus with hyperglycemia: Secondary | ICD-10-CM | POA: Insufficient documentation

## 2024-02-10 DIAGNOSIS — Z8673 Personal history of transient ischemic attack (TIA), and cerebral infarction without residual deficits: Secondary | ICD-10-CM | POA: Insufficient documentation

## 2024-05-01 ENCOUNTER — Ambulatory Visit: Admitting: Podiatry

## 2024-05-29 ENCOUNTER — Encounter: Payer: Self-pay | Admitting: Podiatry

## 2024-05-29 ENCOUNTER — Ambulatory Visit: Admitting: Podiatry

## 2024-05-29 DIAGNOSIS — B351 Tinea unguium: Secondary | ICD-10-CM

## 2024-05-29 DIAGNOSIS — E1142 Type 2 diabetes mellitus with diabetic polyneuropathy: Secondary | ICD-10-CM | POA: Diagnosis not present

## 2024-05-29 DIAGNOSIS — M79675 Pain in left toe(s): Secondary | ICD-10-CM | POA: Diagnosis not present

## 2024-05-29 DIAGNOSIS — Z0189 Encounter for other specified special examinations: Secondary | ICD-10-CM

## 2024-05-29 DIAGNOSIS — E119 Type 2 diabetes mellitus without complications: Secondary | ICD-10-CM | POA: Diagnosis not present

## 2024-05-29 DIAGNOSIS — Q828 Other specified congenital malformations of skin: Secondary | ICD-10-CM | POA: Diagnosis not present

## 2024-05-29 DIAGNOSIS — M2142 Flat foot [pes planus] (acquired), left foot: Secondary | ICD-10-CM | POA: Diagnosis not present

## 2024-05-29 DIAGNOSIS — M79674 Pain in right toe(s): Secondary | ICD-10-CM | POA: Diagnosis not present

## 2024-05-29 DIAGNOSIS — M2141 Flat foot [pes planus] (acquired), right foot: Secondary | ICD-10-CM

## 2024-05-29 NOTE — Patient Instructions (Signed)

## 2024-05-29 NOTE — Progress Notes (Signed)
"  °  Subjective:  Patient ID: Kelsey Mendez, female    DOB: 14-Jun-1955,  MRN: [id number]  Kelsey Mendez presents to clinic today for for diabetic foot evaluation and callus(es) left foot and painful mycotic toenails that are difficult to trim. Painful toenails interfere with ambulation. Aggravating factors include wearing enclosed shoe gear. Pain is relieved with periodic professional debridement. Painful calluses are aggravated when weightbearing with and without shoegear. Pain is relieved with periodic professional debridement.  Chief Complaint  Patient presents with   [facility]    [id number] Diabetic foot care/ Dr. Marcey Lolling Last visit October 2025   New problem(s): None.   PCP is Lolling Marcey, MD.  Allergies[1]  Review of Systems: Negative except as noted in the HPI.  Objective: No changes noted in today's physical examination. There were no vitals filed for this visit. Alan I Blake is a pleasant 69 y.o. female morbidly obese in NAD. AAO x 3.   Diabetic foot exam was performed with the following findings:   Vascular Examination: Capillary refill time immediate b/l. Vascular status intact b/l with palpable pedal pulses. Pedal hair present b/l. No pain with calf compression b/l. Skin temperature gradient WNL b/l. No cyanosis or clubbing b/l. No ischemia or gangrene noted b/l.   Neurological Examination: Sensation grossly intact b/l with 10 gram monofilament. Vibratory sensation intact b/l.   Dermatological Examination: Pedal skin with normal turgor, texture and tone b/l.  No open wounds. No interdigital macerations.   Toenails 1-5 b/l thick, discolored, elongated with subungual debris and pain on dorsal palpation.   Hyperkeratotic lesion(s) submet head 1 left foot, submet head 3 left foot, and submet head 5 left foot.  No erythema, no edema, no drainage, no fluctuance.  Musculoskeletal Examination: Muscle strength 5/5 to all lower extremity muscle groups bilaterally. Pes  planus deformity noted bilateral LE. Patient ambulates independent of any assistive aids.  Radiographs: None   Assessment/Plan: 1. Pain due to onychomycosis of toenails of both feet   2. Porokeratosis   3. Type 2 diabetes mellitus with peripheral neuropathy (HCC)   4. Pes planus of both feet   5. Encounter for diabetic foot exam (HCC)   Diabetic foot examination performed today.  All patient's and/or POA's questions/concerns addressed on today's visit. Toenails 1-5 b/l debrided in length and girth without incident. Porokeratotic lesion(s) submet head 1 left foot, submet head 3 left foot, and submet head 5 left foot pared with sharp debridement without incident. Continue daily foot inspections and monitor blood glucose per PCP/Endocrinologist's recommendations. Continue soft, supportive shoe gear daily. Report any pedal injuries to medical professional. Call office if there are any questions/concerns.  Return in about 3 months (around 08/27/2024).  Delon LITTIE Merlin, DPM      [facility] LOCATION: [address], [place] [address]                   Office [phone]   [facility] LOCATION: [address] [place], [place] [address] Office [phone]      [1]  Allergies Allergen Reactions   Chlorthalidone    Hydrochlorothiazide    "

## 2024-06-03 ENCOUNTER — Encounter: Payer: Self-pay | Admitting: Podiatry

## 2024-08-29 ENCOUNTER — Ambulatory Visit: Admitting: Podiatry
